# Patient Record
Sex: Female | Born: 1975 | Race: Black or African American | Hispanic: No | Marital: Married | Smoking: Never smoker
Health system: Southern US, Community
[De-identification: ages and names within clinical notes are randomized; demographics above are authoritative.]

## PROBLEM LIST (undated history)

## (undated) DIAGNOSIS — I10 Essential (primary) hypertension: Secondary | ICD-10-CM

## (undated) DIAGNOSIS — R011 Cardiac murmur, unspecified: Secondary | ICD-10-CM

## (undated) DIAGNOSIS — D649 Anemia, unspecified: Secondary | ICD-10-CM

## (undated) HISTORY — DX: Cardiac murmur, unspecified: R01.1

## (undated) HISTORY — PX: TUBAL LIGATION: SHX77

## (undated) HISTORY — DX: Essential (primary) hypertension: I10

## (undated) HISTORY — DX: Anemia, unspecified: D64.9

---

## 2011-08-04 HISTORY — PX: EYE SURGERY: SHX253

## 2013-01-29 ENCOUNTER — Institutional Professional Consult (permissible substitution): Payer: Self-pay | Admitting: Medical

## 2013-02-19 ENCOUNTER — Institutional Professional Consult (permissible substitution): Payer: Self-pay | Admitting: Medical

## 2013-03-13 ENCOUNTER — Encounter: Payer: Self-pay | Admitting: Obstetrics and Gynecology

## 2013-03-13 ENCOUNTER — Ambulatory Visit (INDEPENDENT_AMBULATORY_CARE_PROVIDER_SITE_OTHER): Payer: No Typology Code available for payment source | Admitting: Obstetrics and Gynecology

## 2013-03-13 VITALS — BP 131/95 | HR 67 | Ht 61.5 in | Wt 163.0 lb

## 2013-03-13 DIAGNOSIS — N938 Other specified abnormal uterine and vaginal bleeding: Secondary | ICD-10-CM

## 2013-03-13 DIAGNOSIS — N949 Unspecified condition associated with female genital organs and menstrual cycle: Secondary | ICD-10-CM

## 2013-03-13 LAB — TSH: TSH: 0.675 u[IU]/mL (ref 0.350–4.500)

## 2013-03-13 MED ORDER — NORGESTIMATE-ETH ESTRADIOL 0.25-35 MG-MCG PO TABS: 1.0000 | ORAL_TABLET | Freq: Every day | ORAL | Status: DC

## 2013-03-13 NOTE — Progress Notes (Signed)
  Subjective:    Patient ID: Katelyn Vargas, female    DOB: 01-27-1976, 37 y.o.   MRN: [id number]  HPI  37 yo [id number] with LMP 01/31/2013 and BMI 30 presenting today for evaluation of menorrhagia. Patient reports that since her tubal ligation 7 years ago, although her menses continue to occur on a monthly basis, the flow has become a lot heavier. She states that she at times fear leaving the house because she often soaks through her clothes. She at times skips periods.  Her cycles last 4-5 days with the first 2 days being the heaviest. Patient states that a similar pattern occurred a few years ago, she had a full work up inculding ultrasound which was negative for fibroids. She states that her cycles returned to its usual pattern until the past few years. She admits to being under a lot of stress. She recently moved from [place], is not currently working and her youngest child has a congenital heart defect for which her requires a lot of medical attention. Patient reports some heat intolerance and excessive hair loss in the past few months. She also reports a 20lb weight gain since March which she attributes to an increase in appetite.  Past Medical History  Diagnosis Date  . Anemia   . Heart murmur    Past Surgical History  Procedure Laterality Date  . Tubal ligation    . Eye surgery  08/04/2011    PRK  . Cesarean section  05/31/2004 08/09/2006    x2   Family History  Problem Relation Age of Onset  . Hypertension Mother   . Hyperlipidemia Mother   . Heart disease Mother   . Hyperlipidemia Father   . Seizures Daughter   . Heart disease Son     congential  . ADD / ADHD Son   . Hypertension Maternal Grandmother   . ADD / ADHD Son    History  Substance Use Topics  . Smoking status: Never Smoker   . Smokeless tobacco: Never Used  . Alcohol Use: Yes     Comment: social     Review of Systems  All other systems reviewed and are negative.       Objective:   Physical  Exam GENERAL: Well-developed, well-nourished female in no acute distress.  NECK: Supple. Normal thyroid.  ABDOMEN: Soft, nontender, nondistended. Obese PELVIC: Normal external female genitalia. Vagina is pink and rugated.  Normal discharge. Normal appearing cervix. Bimanual limited secondary to body habitus. No adnexal mass or tenderness. EXTREMITIES: No cyanosis, clubbing, or edema, 2+ distal pulses.      Assessment & Plan:  37 yo with menorrhagia and irregular cycles - Will check TSH -Will check pelvic ultrasound - Will try OCP for a few months - Patient advised to increase exercise level to help decrease stress and help with weight loss - RTC in 6 months or prn - Patient will be contacted with any abnormal results.

## 2013-03-15 ENCOUNTER — Ambulatory Visit (HOSPITAL_COMMUNITY): Admission: RE | Admit: 2013-03-15 | Payer: No Typology Code available for payment source | Source: Ambulatory Visit

## 2013-03-18 ENCOUNTER — Ambulatory Visit: Payer: No Typology Code available for payment source | Admitting: Obstetrics and Gynecology

## 2013-03-20 ENCOUNTER — Ambulatory Visit (HOSPITAL_COMMUNITY)
Admission: RE | Admit: 2013-03-20 | Discharge: 2013-03-20 | Disposition: A | Discharge status: Home / Self Care | Payer: No Typology Code available for payment source | Source: Ambulatory Visit | Attending: Obstetrics and Gynecology | Admitting: Obstetrics and Gynecology

## 2013-03-20 DIAGNOSIS — N854 Malposition of uterus: Secondary | ICD-10-CM | POA: Insufficient documentation

## 2013-03-20 DIAGNOSIS — N920 Excessive and frequent menstruation with regular cycle: Secondary | ICD-10-CM | POA: Insufficient documentation

## 2013-03-20 DIAGNOSIS — N938 Other specified abnormal uterine and vaginal bleeding: Secondary | ICD-10-CM

## 2013-03-27 ENCOUNTER — Ambulatory Visit (INDEPENDENT_AMBULATORY_CARE_PROVIDER_SITE_OTHER): Payer: No Typology Code available for payment source | Admitting: Emergency Medicine

## 2013-03-27 VITALS — BP 130/82 | HR 81 | Temp 98.8°F | Resp 16 | Ht 60.0 in | Wt 166.8 lb

## 2013-03-27 DIAGNOSIS — F909 Attention-deficit hyperactivity disorder, unspecified type: Secondary | ICD-10-CM

## 2013-03-27 MED ORDER — AMPHETAMINE-DEXTROAMPHETAMINE 10 MG PO TABS: 10.0000 mg | ORAL_TABLET | Freq: Two times a day (BID) | ORAL | Status: DC

## 2013-03-27 NOTE — Progress Notes (Signed)
[facility] [address] [place] [address] [phone]  Date:  03/27/2013   Name:  Katelyn Vargas   DOB:  09-29-1976   MRN:  [id number]  PCP:  Ernst Breach, PA-C    Chief Complaint: ADHD   History of Present Illness:  Katelyn Vargas is a 37 y.o. very pleasant female patient who presents with the following:  Moved here from [facility] and was evaluated by psychologist and referred for treatment of ADD.  She has provided a copy of her report.  She describes difficulty maintaining concentration and focus.  No improvement with over the counter medications or other home remedies. Denies other complaint or health concern today..  She is eating and sleeping normally.  There are no active problems to display for this patient.   Past Medical History  Diagnosis Date  . Anemia   . Heart murmur     Past Surgical History  Procedure Laterality Date  . Tubal ligation    . Eye surgery  08/04/2011    PRK  . Cesarean section  05/31/2004 08/09/2006    x2    History  Substance Use Topics  . Smoking status: Never Smoker   . Smokeless tobacco: Never Used  . Alcohol Use: Yes     Comment: social    Family History  Problem Relation Age of Onset  . Hypertension Mother   . Hyperlipidemia Mother   . Heart disease Mother   . Hyperlipidemia Father   . Seizures Daughter   . Heart disease Son     congential  . ADD / ADHD Son   . Hypertension Maternal Grandmother   . ADD / ADHD Son   . Hypertension Sister   . Mental illness Maternal Grandfather   . Dementia Paternal Grandfather     No Known Allergies  Medication list has been reviewed and updated.  Current Outpatient Prescriptions on File Prior to Visit  Medication Sig Dispense Refill  . ferrous fumarate (HEMOCYTE - 106 MG FE) 325 (106 FE) MG TABS Take 1 tablet by mouth daily.      Marland Kitchen lubiprostone (AMITIZA) 24 MCG capsule Take 24 mcg by mouth daily with breakfast.      . norgestimate-ethinyl estradiol  (ORTHO-CYCLEN,SPRINTEC,PREVIFEM) 0.25-35 MG-MCG tablet Take 1 tablet by mouth daily.  1 Package  6   No current facility-administered medications on file prior to visit.    Review of Systems:  As per HPI, otherwise negative.    Physical Examination: Filed Vitals:   03/27/13 1844  BP: 130/82  Pulse: 81  Temp: 98.8 F (37.1 C)  Resp: 16   Filed Vitals:   03/27/13 1844  Height: 5' (1.524 m)  Weight: 166 lb 12.8 oz (75.66 kg)   Body mass index is 32.58 kg/(m^2). Ideal Body Weight: Weight in (lb) to have BMI = 25: 127.7  GEN: WDWN, NAD, Non-toxic, A & O x 3 HEENT: Atraumatic, Normocephalic. Neck supple. No masses, No LAD. Ears and Nose: No external deformity. CV: RRR, No M/G/R. No JVD. No thrill. No extra heart sounds. PULM: CTA B, no wheezes, crackles, rhonchi. No retractions. No resp. distress. No accessory muscle use. ABD: S, NT, ND, +BS. No rebound. No HSM. EXTR: No c/c/e NEURO Normal gait.  PSYCH: Normally interactive. Conversant. Not depressed or anxious appearing.  Calm demeanor.    Assessment and Plan: ADD adderal Follow up in one month  Signed,  Phillips Odor, MD

## 2013-03-27 NOTE — Patient Instructions (Addendum)
Attention Deficit Hyperactivity Disorder Attention deficit hyperactivity disorder (ADHD) is a problem with behavior issues based on the way the brain functions (neurobehavioral disorder). It is a common reason for behavior and academic problems in school. CAUSES  The cause of ADHD is unknown in most cases. It may run in families. It sometimes can be associated with learning disabilities and other behavioral problems. SYMPTOMS  There are 3 types of ADHD. The 3 types and some of the symptoms include:  Inattentive  Gets bored or distracted easily.  Loses or forgets things. Forgets to hand in homework.  Has trouble organizing or completing tasks.  Difficulty staying on task.  An inability to organize daily tasks and school work.  Leaving projects, chores, or homework unfinished.  Trouble paying attention or responding to details. Careless mistakes.  Difficulty following directions. Often seems like is not listening.  Dislikes activities that require sustained attention (like chores or homework).  Hyperactive-impulsive  Feels like it is impossible to sit still or stay in a seat. Fidgeting with hands and feet.  Trouble waiting turn.  Talking too much or out of turn. Interruptive.  Speaks or acts impulsively.  Aggressive, disruptive behavior.  Constantly busy or on the go, noisy.  Combined  Has symptoms of both of the above. Often children with ADHD feel discouraged about themselves and with school. They often perform well below their abilities in school. These symptoms can cause problems in home, school, and in relationships with peers. As children get older, the excess motor activities can calm down, but the problems with paying attention and staying organized persist. Most children do not outgrow ADHD but with good treatment can learn to cope with the symptoms. DIAGNOSIS  When ADHD is suspected, the diagnosis should be made by professionals trained in ADHD.  Diagnosis will  include:  Ruling out other reasons for the child's behavior.  The caregivers will check with the child's school and check their medical records.  They will talk to teachers and parents.  Behavior rating scales for the child will be filled out by those dealing with the child on a daily basis. A diagnosis is made only after all information has been considered. TREATMENT  Treatment usually includes behavioral treatment often along with medicines. It may include stimulant medicines. The stimulant medicines decrease impulsivity and hyperactivity and increase attention. Other medicines used include antidepressants and certain blood pressure medicines. Most experts agree that treatment for ADHD should address all aspects of the child's functioning. Treatment should not be limited to the use of medicines alone. Treatment should include structured classroom management. The parents must receive education to address rewarding good behavior, discipline, and limit-setting. Tutoring or behavioral therapy or both should be available for the child. If untreated, the disorder can have long-term serious effects into adolescence and adulthood. HOME CARE INSTRUCTIONS   Often with ADHD there is a lot of frustration among the family in dealing with the illness. There is often blame and anger that is not warranted. This is a life long illness. There is no way to prevent ADHD. In many cases, because the problem affects the family as a whole, the entire family may need help. A therapist can help the family find better ways to handle the disruptive behaviors and promote change. If the child is young, most of the therapist's work is with the parents. Parents will learn techniques for coping with and improving their child's behavior. Sometimes only the child with the ADHD needs counseling. Your caregivers can help   you make these decisions.  Children with ADHD may need help in organizing. Some helpful tips include:  Keep  routines the same every day from wake-up time to bedtime. Schedule everything. This includes homework and playtime. This should include outdoor and indoor recreation. Keep the schedule on the refrigerator or a bulletin board where it is frequently seen. Mark schedule changes as far in advance as possible.  Have a place for everything and keep everything in its place. This includes clothing, backpacks, and school supplies.  Encourage writing down assignments and bringing home needed books.  Offer your child a well-balanced diet. Breakfast is especially important for school performance. Children should avoid drinks with caffeine including:  Soft drinks.  Coffee.  Tea.  However, some older children (adolescents) may find these drinks helpful in improving their attention.  Children with ADHD need consistent rules that they can understand and follow. If rules are followed, give small rewards. Children with ADHD often receive, and expect, criticism. Look for good behavior and praise it. Set realistic goals. Give clear instructions. Look for activities that can foster success and self-esteem. Make time for pleasant activities with your child. Give lots of affection.  Parents are their children's greatest advocates. Learn as much as possible about ADHD. This helps you become a stronger and better advocate for your child. It also helps you educate your child's teachers and instructors if they feel inadequate in these areas. Parent support groups are often helpful. A national group with local chapters is called CHADD (Children and Adults with Attention Deficit Hyperactivity Disorder). PROGNOSIS  There is no cure for ADHD. Children with the disorder seldom outgrow it. Many find adaptive ways to accommodate the ADHD as they mature. SEEK MEDICAL CARE IF:  Your child has repeated muscle twitches, cough or speech outbursts.  Your child has sleep problems.  Your child has a marked loss of  appetite.  Your child develops depression.  Your child has new or worsening behavioral problems.  Your child develops dizziness.  Your child has a racing heart.  Your child has stomach pains.  Your child develops headaches. Document Released: 09/09/2002 Document Revised: 12/12/2011 Document Reviewed: 04/21/2008 [employer] Patient Information 2014 [employer].  

## 2013-09-13 ENCOUNTER — Encounter: Payer: Self-pay | Admitting: Obstetrics and Gynecology

## 2013-09-13 ENCOUNTER — Ambulatory Visit (INDEPENDENT_AMBULATORY_CARE_PROVIDER_SITE_OTHER): Payer: No Typology Code available for payment source | Admitting: Obstetrics and Gynecology

## 2013-09-13 VITALS — BP 144/101 | HR 73 | Ht 61.0 in | Wt 166.0 lb

## 2013-09-13 DIAGNOSIS — M549 Dorsalgia, unspecified: Secondary | ICD-10-CM

## 2013-09-13 LAB — POCT URINALYSIS DIPSTICK
Bilirubin, UA: NEGATIVE
Blood, UA: NEGATIVE
Glucose, UA: NEGATIVE
Ketones, UA: NEGATIVE
Protein, UA: NEGATIVE
Spec Grav, UA: <=1.005
pH, UA: 6

## 2013-09-13 MED ORDER — NORGESTIMATE-ETH ESTRADIOL 0.25-35 MG-MCG PO TABS: 1.0000 | ORAL_TABLET | Freq: Every day | ORAL | Status: DC

## 2013-09-13 MED ORDER — CYCLOBENZAPRINE HCL 10 MG PO TABS: 10.0000 mg | ORAL_TABLET | Freq: Three times a day (TID) | ORAL | Status: DC | PRN

## 2013-09-13 NOTE — Progress Notes (Signed)
Patient ID: Katelyn Vargas, female   DOB: [birth year], 37 y.o.   MRN: [id number] 37 yo here for evaluation of 2-day history of right flank pain. Patient describes the pain as being Javanni Maring and stabbing in nature. She denies any heavy lifting or dysuria. Patient is also interested in tubal reversible as she wishes to have one more child.   GENERAL: Well-developed, well-nourished female in no acute distress.  ABDOMEN: Soft, nontender, nondistended. No organomegaly. Back: NO CVA tenderness, pain is reproducible on exam in right flank EXTREMITIES: No cyanosis, clubbing, or edema, 2+ distal pulses.  A/P 37 yo with Right flank pain - UA negative- will send Ucx - Pain likely musculoskeletal Rx flexeril provided - Patient noted to have high blood pressure. 3 refills of OCP provided. Information on progesterone only birth control provided - Referred to PCP for management of HTN - Referred to infertility for possible tubal reversal - RTC prn

## 2013-09-13 NOTE — Patient Instructions (Signed)
Medroxyprogesterone injection [Contraceptive] What is this medicine? MEDROXYPROGESTERONE (me DROX ee proe JES te rone) contraceptive injections prevent pregnancy. They provide effective birth control for 3 months. Depo-subQ Provera 104 is also used for treating pain related to endometriosis. This medicine may be used for other purposes; ask your health care provider or pharmacist if you have questions. COMMON BRAND NAME(S): Depo-Provera, Depo-subQ Provera 104 What should I tell my health care provider before I take this medicine? They need to know if you have any of these conditions: -frequently drink alcohol -asthma -blood vessel disease or a history of a blood clot in the lungs or legs -bone disease such as osteoporosis -breast cancer -diabetes -eating disorder (anorexia nervosa or bulimia) -high blood pressure -HIV infection or AIDS -kidney disease -liver disease -mental depression -migraine -seizures (convulsions) -stroke -tobacco smoker -vaginal bleeding -an unusual or allergic reaction to medroxyprogesterone, other hormones, medicines, foods, dyes, or preservatives -pregnant or trying to get pregnant -breast-feeding How should I use this medicine? Depo-Provera Contraceptive injection is given into a muscle. Depo-subQ Provera 104 injection is given under the skin. These injections are given by a health care professional. You must not be pregnant before getting an injection. The injection is usually given during the first 5 days after the start of a menstrual period or 6 weeks after delivery of a baby. Talk to your pediatrician regarding the use of this medicine in children. Special care may be needed. These injections have been used in female children who have started having menstrual periods. Overdosage: If you think you have taken too much of this medicine contact a poison control center or emergency room at once. NOTE: This medicine is only for you. Do not share this medicine  with others. What if I miss a dose? Try not to miss a dose. You must get an injection once every 3 months to maintain birth control. If you cannot keep an appointment, call and reschedule it. If you wait longer than 13 weeks between Depo-Provera contraceptive injections or longer than 14 weeks between Depo-subQ Provera 104 injections, you could get pregnant. Use another method for birth control if you miss your appointment. You may also need a pregnancy test before receiving another injection. What may interact with this medicine? Do not take this medicine with any of the following medications: -bosentan This medicine may also interact with the following medications: -aminoglutethimide -antibiotics or medicines for infections, especially rifampin, rifabutin, rifapentine, and griseofulvin -aprepitant -barbiturate medicines such as phenobarbital or primidone -bexarotene -carbamazepine -medicines for seizures like ethotoin, felbamate, oxcarbazepine, phenytoin, topiramate -modafinil -St. John's wort This list may not describe all possible interactions. Give your health care provider a list of all the medicines, herbs, non-prescription drugs, or dietary supplements you use. Also tell them if you smoke, drink alcohol, or use illegal drugs. Some items may interact with your medicine. What should I watch for while using this medicine? This drug does not protect you against HIV infection (AIDS) or other sexually transmitted diseases. Use of this product may cause you to lose calcium from your bones. Loss of calcium may cause weak bones (osteoporosis). Only use this product for more than 2 years if other forms of birth control are not right for you. The longer you use this product for birth control the more likely you will be at risk for weak bones. Ask your health care professional how you can keep strong bones. You may have a change in bleeding pattern or irregular periods. Many females stop having    periods while taking this drug. If you have received your injections on time, your chance of being pregnant is very low. If you think you may be pregnant, see your health care professional as soon as possible. Tell your health care professional if you want to get pregnant within the next year. The effect of this medicine may last a long time after you get your last injection. What side effects may I notice from receiving this medicine? Side effects that you should report to your doctor or health care professional as soon as possible: -allergic reactions like skin rash, itching or hives, swelling of the face, lips, or tongue -breast tenderness or discharge -breathing problems -changes in vision -depression -feeling faint or lightheaded, falls -fever -pain in the abdomen, chest, groin, or leg -problems with balance, talking, walking -unusually weak or tired -yellowing of the eyes or skin Side effects that usually do not require medical attention (report to your doctor or health care professional if they continue or are bothersome): -acne -fluid retention and swelling -headache -irregular periods, spotting, or absent periods -temporary pain, itching, or skin reaction at site where injected -weight gain This list may not describe all possible side effects. Call your doctor for medical advice about side effects. You may report side effects to FDA at 1-800-FDA-1088. Where should I keep my medicine? This does not apply. The injection will be given to you by a health care professional. NOTE: This sheet is a summary. It may not cover all possible information. If you have questions about this medicine, talk to your doctor, pharmacist, or health care provider.  2014, [employer] Standard. (2008-10-10 18:37:56) Etonogestrel implant What is this medicine? ETONOGESTREL (et oh noe JES trel) is a contraceptive (birth control) device. It is used to prevent pregnancy. It can be used for up to 3  years. This medicine may be used for other purposes; ask your health care provider or pharmacist if you have questions. COMMON BRAND NAME(S): Implanon, Nexplanon  What should I tell my health care provider before I take this medicine? They need to know if you have any of these conditions: -abnormal vaginal bleeding -blood vessel disease or blood clots -cancer of the breast, cervix, or liver -depression -diabetes -gallbladder disease -headaches -heart disease or recent heart attack -high blood pressure -high cholesterol -kidney disease -liver disease -renal disease -seizures -tobacco smoker -an unusual or allergic reaction to etonogestrel, other hormones, anesthetics or antiseptics, medicines, foods, dyes, or preservatives -pregnant or trying to get pregnant -breast-feeding How should I use this medicine? This device is inserted just under the skin on the inner side of your upper arm by a health care professional. Talk to your pediatrician regarding the use of this medicine in children. Special care may be needed. Overdosage: If you think you've taken too much of this medicine contact a poison control center or emergency room at once. Overdosage: If you think you have taken too much of this medicine contact a poison control center or emergency room at once. NOTE: This medicine is only for you. Do not share this medicine with others. What if I miss a dose? This does not apply. What may interact with this medicine? Do not take this medicine with any of the following medications: -amprenavir -bosentan -fosamprenavir This medicine may also interact with the following medications: -barbiturate medicines for inducing sleep or treating seizures -certain medicines for fungal infections like ketoconazole and itraconazole -griseofulvin -medicines to treat seizures like carbamazepine, felbamate, oxcarbazepine, phenytoin, topiramate -modafinil -phenylbutazone -rifampin -some  medicines  to treat HIV infection like atazanavir, indinavir, lopinavir, nelfinavir, tipranavir, ritonavir -St. John's wort This list may not describe all possible interactions. Give your health care provider a list of all the medicines, herbs, non-prescription drugs, or dietary supplements you use. Also tell them if you smoke, drink alcohol, or use illegal drugs. Some items may interact with your medicine. What should I watch for while using this medicine? This product does not protect you against HIV infection (AIDS) or other sexually transmitted diseases. You should be able to feel the implant by pressing your fingertips over the skin where it was inserted. Tell your doctor if you cannot feel the implant. What side effects may I notice from receiving this medicine? Side effects that you should report to your doctor or health care professional as soon as possible: -allergic reactions like skin rash, itching or hives, swelling of the face, lips, or tongue -breast lumps -changes in vision -confusion, trouble speaking or understanding -dark urine -depressed mood -general ill feeling or flu-like symptoms -light-colored stools -loss of appetite, nausea -right upper belly pain -severe headaches -severe pain, swelling, or tenderness in the abdomen -shortness of breath, chest pain, swelling in a leg -signs of pregnancy -sudden numbness or weakness of the face, arm or leg -trouble walking, dizziness, loss of balance or coordination -unusual vaginal bleeding, discharge -unusually weak or tired -yellowing of the eyes or skin Side effects that usually do not require medical attention (Report these to your doctor or health care professional if they continue or are bothersome.): -acne -breast pain -changes in weight -cough -fever or chills -headache -irregular menstrual bleeding -itching, burning, and vaginal discharge -pain or difficulty passing urine -sore throat This list may not describe all  possible side effects. Call your doctor for medical advice about side effects. You may report side effects to FDA at 1-800-FDA-1088. Where should I keep my medicine? This drug is given in a hospital or clinic and will not be stored at home. NOTE: This sheet is a summary. It may not cover all possible information. If you have questions about this medicine, talk to your doctor, pharmacist, or health care provider.  2014, [employer] Standard. (2012-03-26 15:37:45) Levonorgestrel intrauterine device (IUD) What is this medicine? LEVONORGESTREL IUD (LEE voe nor jes trel) is a contraceptive (birth control) device. The device is placed inside the uterus by a healthcare professional. It is used to prevent pregnancy and can also be used to treat heavy bleeding that occurs during your period. Depending on the device, it can be used for 3 to 5 years. This medicine may be used for other purposes; ask your health care provider or pharmacist if you have questions. COMMON BRAND NAME(S): Gretta Cool What should I tell my health care provider before I take this medicine? They need to know if you have any of these conditions: -abnormal Pap smear -cancer of the breast, uterus, or cervix -diabetes -endometritis -genital or pelvic infection now or in the past -have more than one sexual partner or your partner has more than one partner -heart disease -history of an ectopic or tubal pregnancy -immune system problems -IUD in place -liver disease or tumor -problems with blood clots or take blood-thinners -use intravenous drugs -uterus of unusual shape -vaginal bleeding that has not been explained -an unusual or allergic reaction to levonorgestrel, other hormones, silicone, or polyethylene, medicines, foods, dyes, or preservatives -pregnant or trying to get pregnant -breast-feeding How should I use this medicine? This device is placed inside the uterus  by a health care professional. Talk to your  pediatrician regarding the use of this medicine in children. Special care may be needed. Overdosage: If you think you have taken too much of this medicine contact a poison control center or emergency room at once. NOTE: This medicine is only for you. Do not share this medicine with others. What if I miss a dose? This does not apply. What may interact with this medicine? Do not take this medicine with any of the following medications: -amprenavir -bosentan -fosamprenavir This medicine may also interact with the following medications: -aprepitant -barbiturate medicines for inducing sleep or treating seizures -bexarotene -griseofulvin -medicines to treat seizures like carbamazepine, ethotoin, felbamate, oxcarbazepine, phenytoin, topiramate -modafinil -pioglitazone -rifabutin -rifampin -rifapentine -some medicines to treat HIV infection like atazanavir, indinavir, lopinavir, nelfinavir, tipranavir, ritonavir -St. John's wort -warfarin This list may not describe all possible interactions. Give your health care provider a list of all the medicines, herbs, non-prescription drugs, or dietary supplements you use. Also tell them if you smoke, drink alcohol, or use illegal drugs. Some items may interact with your medicine. What should I watch for while using this medicine? Visit your doctor or health care professional for regular check ups. See your doctor if you or your partner has sexual contact with others, becomes HIV positive, or gets a sexual transmitted disease. This product does not protect you against HIV infection (AIDS) or other sexually transmitted diseases. You can check the placement of the IUD yourself by reaching up to the top of your vagina with clean fingers to feel the threads. Do not pull on the threads. It is a good habit to check placement after each menstrual period. Call your doctor right away if you feel more of the IUD than just the threads or if you cannot feel the threads  at all. The IUD may come out by itself. You may become pregnant if the device comes out. If you notice that the IUD has come out use a backup birth control method like condoms and call your health care provider. Using tampons will not change the position of the IUD and are okay to use during your period. What side effects may I notice from receiving this medicine? Side effects that you should report to your doctor or health care professional as soon as possible: -allergic reactions like skin rash, itching or hives, swelling of the face, lips, or tongue -fever, flu-like symptoms -genital sores -high blood pressure -no menstrual period for 6 weeks during use -pain, swelling, warmth in the leg -pelvic pain or tenderness -severe or sudden headache -signs of pregnancy -stomach cramping -sudden shortness of breath -trouble with balance, talking, or walking -unusual vaginal bleeding, discharge -yellowing of the eyes or skin Side effects that usually do not require medical attention (report to your doctor or health care professional if they continue or are bothersome): -acne -breast pain -change in sex drive or performance -changes in weight -cramping, dizziness, or faintness while the device is being inserted -headache -irregular menstrual bleeding within first 3 to 6 months of use -nausea This list may not describe all possible side effects. Call your doctor for medical advice about side effects. You may report side effects to FDA at 1-800-FDA-1088. Where should I keep my medicine? This does not apply. NOTE: This sheet is a summary. It may not cover all possible information. If you have questions about this medicine, talk to your doctor, pharmacist, or health care provider.  2014, [employer]. (2011-10-20 13:54:04)

## 2013-09-15 LAB — URINE CULTURE: Colony Count: 9000

## 2013-09-24 ENCOUNTER — Telehealth: Payer: Self-pay | Admitting: Internal Medicine

## 2013-09-24 NOTE — Telephone Encounter (Signed)
Left mssg for pt to call back.

## 2013-09-24 NOTE — Telephone Encounter (Signed)
I can see her on 12/31 if she is availabe, just take 2 15 minute slots

## 2013-09-24 NOTE — Telephone Encounter (Signed)
Pt is scheduled for a new patient apptmt on 10/07/2013, however, she is starting a new job that day and doesn't want to go in late on her first day.  Can you accommodate a new patient apptmt prior to 10/07/2013? Thank you.

## 2013-10-07 ENCOUNTER — Ambulatory Visit: Payer: No Typology Code available for payment source | Admitting: Internal Medicine

## 2013-10-22 NOTE — Telephone Encounter (Signed)
Pt scheduled 10/24/2013

## 2013-10-24 ENCOUNTER — Ambulatory Visit: Payer: No Typology Code available for payment source | Admitting: Internal Medicine

## 2013-10-24 DIAGNOSIS — Z0289 Encounter for other administrative examinations: Secondary | ICD-10-CM

## 2013-11-01 ENCOUNTER — Encounter: Payer: Self-pay | Admitting: Physician Assistant

## 2013-11-01 ENCOUNTER — Ambulatory Visit (INDEPENDENT_AMBULATORY_CARE_PROVIDER_SITE_OTHER): Payer: No Typology Code available for payment source | Admitting: Physician Assistant

## 2013-11-01 VITALS — BP 130/60 | HR 81 | Temp 99.2°F | Resp 18 | Ht 61.0 in | Wt 169.8 lb

## 2013-11-01 DIAGNOSIS — Z0000 Encounter for general adult medical examination without abnormal findings: Secondary | ICD-10-CM

## 2013-11-01 DIAGNOSIS — F909 Attention-deficit hyperactivity disorder, unspecified type: Secondary | ICD-10-CM

## 2013-11-01 DIAGNOSIS — K5900 Constipation, unspecified: Secondary | ICD-10-CM

## 2013-11-01 DIAGNOSIS — D649 Anemia, unspecified: Secondary | ICD-10-CM

## 2013-11-01 MED ORDER — LUBIPROSTONE 24 MCG PO CAPS
24.0000 ug | ORAL_CAPSULE | Freq: Every day | ORAL | Status: DC
Start: 2013-11-01 — End: 2016-12-12

## 2013-11-01 MED ORDER — AMPHETAMINE-DEXTROAMPHETAMINE 10 MG PO TABS: 10.0000 mg | ORAL_TABLET | Freq: Two times a day (BID) | ORAL | Status: DC

## 2013-11-01 NOTE — Patient Instructions (Signed)
It was great meeting you today Katelyn Vargas!  Labs have been ordered for you, when you report to lab please be fasting.    Anemia, Nonspecific Anemia is a condition in which the concentration of red blood cells or hemoglobin in the blood is below normal. Hemoglobin is a substance in red blood cells that carries oxygen to the tissues of the body. Anemia results in not enough oxygen reaching these tissues.  CAUSES  Common causes of anemia include:   Excessive bleeding. Bleeding may be internal or external. This includes excessive bleeding from periods (in women) or from the intestine.   Poor nutrition.   Chronic kidney, thyroid, and liver disease.  Bone marrow disorders that decrease red blood cell production.  Cancer and treatments for cancer.  HIV, AIDS, and their treatments.  Spleen problems that increase red blood cell destruction.  Blood disorders.  Excess destruction of red blood cells due to infection, medicines, and autoimmune disorders. SIGNS AND SYMPTOMS   Minor weakness.   Dizziness.   Headache.  Palpitations.   Shortness of breath, especially with exercise.   Paleness.  Cold sensitivity.  Indigestion.  Nausea.  Difficulty sleeping.  Difficulty concentrating. Symptoms may occur suddenly or they may develop slowly.  DIAGNOSIS  Additional blood tests are often needed. These help your health care provider determine the best treatment. Your health care provider will check your stool for blood and look for other causes of blood loss.  TREATMENT  Treatment varies depending on the cause of the anemia. Treatment can include:   Supplements of iron, vitamin C37, or folic acid.   Hormone medicines.   A blood transfusion. This may be needed if blood loss is severe.   Hospitalization. This may be needed if there is significant continual blood loss.   Dietary changes.  Spleen removal. HOME CARE INSTRUCTIONS Keep all follow-up appointments. It often  takes many weeks to correct anemia, and having your health care provider check on your condition and your response to treatment is very important. SEEK IMMEDIATE MEDICAL CARE IF:   You develop extreme weakness, shortness of breath, or chest pain.   You become dizzy or have trouble concentrating.  You develop heavy vaginal bleeding.   You develop a rash.   You have bloody or black, tarry stools.   You faint.   You vomit up blood.   You vomit repeatedly.   You have abdominal pain.  You have a fever or persistent symptoms for more than 2 3 days.   You have a fever and your symptoms suddenly get worse.   You are dehydrated.  MAKE SURE YOU:  Understand these instructions.  Will watch your condition.  Will get help right away if you are not doing well or get worse. Document Released: 10/27/2004 Document Revised: 05/22/2013 Document Reviewed: 03/15/2013 [facility] Patient Information 2014 [place]. Attention Deficit Hyperactivity Disorder Attention deficit hyperactivity disorder (ADHD) is a problem with behavior issues based on the way the brain functions (neurobehavioral disorder). It is a common reason for behavior and academic problems in school. SYMPTOMS  There are 3 types of ADHD. The 3 types and some of the symptoms include:  Inattentive  Gets bored or distracted easily.  Loses or forgets things. Forgets to hand in homework.  Has trouble organizing or completing tasks.  Difficulty staying on task.  An inability to organize daily tasks and school work.  Leaving projects, chores, or homework unfinished.  Trouble paying attention or responding to details. Careless mistakes.  Difficulty following directions. Often seems like is not listening.  Dislikes activities that require sustained attention (like chores or homework).  Hyperactive-impulsive  Feels like it is impossible to sit still or stay in a seat. Fidgeting with hands and feet.  Trouble  waiting turn.  Talking too much or out of turn. Interruptive.  Speaks or acts impulsively.  Aggressive, disruptive behavior.  Constantly busy or on the go, noisy.  Often leaves seat when they are expected to remain seated.  Often runs or climbs where it is not appropriate, or feels very restless.  Combined  Has symptoms of both of the above. Often children with ADHD feel discouraged about themselves and with school. They often perform well below their abilities in school. As children get older, the excess motor activities can calm down, but the problems with paying attention and staying organized persist. Most children do not outgrow ADHD but with good treatment can learn to cope with the symptoms. DIAGNOSIS  When ADHD is suspected, the diagnosis should be made by professionals trained in ADHD. This professional will collect information about the individual suspected of having ADHD. Information must be collected from various settings where the person lives, works, or attends school.  Diagnosis will include:  Confirming symptoms began in childhood.  Ruling out other reasons for the child's behavior.  The health care providers will check with the child's school and check their medical records.  They will talk to teachers and parents.  Behavior rating scales for the child will be filled out by those dealing with the child on a daily basis. A diagnosis is made only after all information has been considered. TREATMENT  Treatment usually includes behavioral treatment, tutoring or extra support in school, and stimulant medicines. Because of the way a person's brain works with ADHD, these medicines decrease impulsivity and hyperactivity and increase attention. This is different than how they would work in a person who does not have ADHD. Other medicines used include antidepressants and certain blood pressure medicines. Most experts agree that treatment for ADHD should address all aspects  of the person's functioning. Along with medicines, treatment should include structured classroom management at school. Parents should reward good behavior, provide constant discipline, and limit-setting. Tutoring should be available for the child as needed. ADHD is a life-long condition. If untreated, the disorder can have long-term serious effects into adolescence and adulthood. HOME CARE INSTRUCTIONS   Often with ADHD there is a lot of frustration among family members dealing with the condition. Blame and anger are also feelings that are common. In many cases, because the problem affects the family as a whole, the entire family may need help. A therapist can help the family find better ways to handle the disruptive behaviors of the person with ADHD and promote change. If the person with ADHD is young, most of the therapist's work is with the parents. Parents will learn techniques for coping with and improving their child's behavior. Sometimes only the child with the ADHD needs counseling. Your health care providers can help you make these decisions.  Children with ADHD may need help learning how to organize. Some helpful tips include:  Keep routines the same every day from wake-up time to bedtime. Schedule all activities, including homework and playtime. Keep the schedule in a place where the person with ADHD will often see it. Mark schedule changes as far in advance as possible.  Schedule outdoor and indoor recreation.  Have a place for everything and keep everything in its  place. This includes clothing, backpacks, and school supplies.  Encourage writing down assignments and bringing home needed books. Work with your child's teachers for assistance in organizing school work.  Offer your child a well-balanced diet. Breakfast that includes a balance of whole grains, protein and, fruits or vegetables is especially important for school performance. Children should avoid drinks with caffeine  including:  Soft drinks.  Coffee.  Tea.  However, some older children (adolescents) may find these drinks helpful in improving their attention. Because it can also be common for adolescents with ADHD to become addicted to caffeine, talk with your health care provider about what is a safe amount of caffeine intake for your child.  Children with ADHD need consistent rules that they can understand and follow. If rules are followed, give small rewards. Children with ADHD often receive, and expect, criticism. Look for good behavior and praise it. Set realistic goals. Give clear instructions. Look for activities that can foster success and self-esteem. Make time for pleasant activities with your child. Give lots of affection.  Parents are their children's greatest advocates. Learn as much as possible about ADHD. This helps you become a stronger and better advocate for your child. It also helps you educate your child's teachers and instructors if they feel inadequate in these areas. Parent support groups are often helpful. A national group with local chapters is called Children and Adults with Attention Deficit Hyperactivity Disorder (CHADD). SEEK MEDICAL CARE IF:  Your child has repeated muscle twitches, cough or speech outbursts.  Your child has sleep problems.  Your child has a marked loss of appetite.  Your child develops depression.  Your child has new or worsening behavioral problems.  Your child develops dizziness.  Your child has a racing heart.  Your child has stomach pains.  Your child develops headaches. SEEK IMMEDIATE MEDICAL CARE IF:  Your child has been diagnosed with depression or anxiety and the symptoms seem to be getting worse.  Your child has been depressed and suddenly appears to have increased energy or motivation.  You are worried that your child is having a bad reaction to a medication he or she is taking for ADHD. Document Released: 09/09/2002 Document Revised:  07/10/2013 Document Reviewed: 05/27/2013 [facility] Patient Information 2014 [place]. Health Maintenance, Female A healthy lifestyle and preventative care can promote health and wellness.  Maintain regular health, dental, and eye exams.  Eat a healthy diet. Foods like vegetables, fruits, whole grains, low-fat dairy products, and lean protein foods contain the nutrients you need without too many calories. Decrease your intake of foods high in solid fats, added sugars, and salt. Get information about a proper diet from your caregiver, if necessary.  Regular physical exercise is one of the most important things you can do for your health. Most adults should get at least 150 minutes of moderate-intensity exercise (any activity that increases your heart rate and causes you to sweat) each week. In addition, most adults need muscle-strengthening exercises on 2 or more days a week.   Maintain a healthy weight. The body mass index (BMI) is a screening tool to identify possible weight problems. It provides an estimate of body fat based on height and weight. Your caregiver can help determine your BMI, and can help you achieve or maintain a healthy weight. For adults 20 years and older:  A BMI below 18.5 is considered underweight.  A BMI of 18.5 to 24.9 is normal.  A BMI of 25 to 29.9 is considered overweight.  A BMI of 30 and above is considered obese.  Maintain normal blood lipids and cholesterol by exercising and minimizing your intake of saturated fat. Eat a balanced diet with plenty of fruits and vegetables. Blood tests for lipids and cholesterol should begin at age 33 and be repeated every 5 years. If your lipid or cholesterol levels are high, you are over 50, or you are a high risk for heart disease, you may need your cholesterol levels checked more frequently.Ongoing high lipid and cholesterol levels should be treated with medicines if diet and exercise are not effective.  If you smoke,  find out from your caregiver how to quit. If you do not use tobacco, do not start.  Lung cancer screening is recommended for adults aged 35 80 years who are at high risk for developing lung cancer because of a history of smoking. Yearly low-dose computed tomography (CT) is recommended for people who have at least a 30-pack-year history of smoking and are a current smoker or have quit within the past 15 years. A pack year of smoking is smoking an average of 1 pack of cigarettes a day for 1 year (for example: 1 pack a day for 30 years or 2 packs a day for 15 years). Yearly screening should continue until the smoker has stopped smoking for at least 15 years. Yearly screening should also be stopped for people who develop a health problem that would prevent them from having lung cancer treatment.  If you are pregnant, do not drink alcohol. If you are breastfeeding, be very cautious about drinking alcohol. If you are not pregnant and choose to drink alcohol, do not exceed 1 drink per day. One drink is considered to be 12 ounces (355 mL) of beer, 5 ounces (148 mL) of wine, or 1.5 ounces (44 mL) of liquor.  Avoid use of street drugs. Do not share needles with anyone. Ask for help if you need support or instructions about stopping the use of drugs.  High blood pressure causes heart disease and increases the risk of stroke. Blood pressure should be checked at least every 1 to 2 years. Ongoing high blood pressure should be treated with medicines, if weight loss and exercise are not effective.  If you are 48 to 38 years old, ask your caregiver if you should take aspirin to prevent strokes.  Diabetes screening involves taking a blood sample to check your fasting blood sugar level. This should be done once every 3 years, after age 16, if you are within normal weight and without risk factors for diabetes. Testing should be considered at a younger age or be carried out more frequently if you are overweight and have at  least 1 risk factor for diabetes.  Breast cancer screening is essential preventative care for women. You should practice "breast self-awareness." This means understanding the normal appearance and feel of your breasts and may include breast self-examination. Any changes detected, no matter how small, should be reported to a caregiver. Women in their 68s and 30s should have a clinical breast exam (CBE) by a caregiver as part of a regular health exam every 1 to 3 years. After age 4, women should have a CBE every year. Starting at age 46, women should consider having a mammogram (breast X-ray) every year. Women who have a family history of breast cancer should talk to their caregiver about genetic screening. Women at a high risk of breast cancer should talk to their caregiver about having an MRI and a  mammogram every year.  Breast cancer gene (BRCA)-related cancer risk assessment is recommended for women who have family members with BRCA-related cancers. BRCA-related cancers include breast, ovarian, tubal, and peritoneal cancers. Having family members with these cancers may be associated with an increased risk for harmful changes (mutations) in the breast cancer genes BRCA1 and BRCA2. Results of the assessment will determine the need for genetic counseling and BRCA1 and BRCA2 testing.  The Pap test is a screening test for cervical cancer. Women should have a Pap test starting at age 23. Between ages 5 and 87, Pap tests should be repeated every 2 years. Beginning at age 66, you should have a Pap test every 3 years as long as the past 3 Pap tests have been normal. If you had a hysterectomy for a problem that was not cancer or a condition that could lead to cancer, then you no longer need Pap tests. If you are between ages 92 and 50, and you have had normal Pap tests going back 10 years, you no longer need Pap tests. If you have had past treatment for cervical cancer or a condition that could lead to cancer, you  need Pap tests and screening for cancer for at least 20 years after your treatment. If Pap tests have been discontinued, risk factors (such as a new sexual partner) need to be reassessed to determine if screening should be resumed. Some women have medical problems that increase the chance of getting cervical cancer. In these cases, your caregiver may recommend more frequent screening and Pap tests.  The human papillomavirus (HPV) test is an additional test that may be used for cervical cancer screening. The HPV test looks for the virus that can cause the cell changes on the cervix. The cells collected during the Pap test can be tested for HPV. The HPV test could be used to screen women aged 44 years and older, and should be used in women of any age who have unclear Pap test results. After the age of 77, women should have HPV testing at the same frequency as a Pap test.  Colorectal cancer can be detected and often prevented. Most routine colorectal cancer screening begins at the age of 31 and continues through age 7. However, your caregiver may recommend screening at an earlier age if you have risk factors for colon cancer. On a yearly basis, your caregiver may provide home test kits to check for hidden blood in the stool. Use of a small camera at the end of a tube, to directly examine the colon (sigmoidoscopy or colonoscopy), can detect the earliest forms of colorectal cancer. Talk to your caregiver about this at age 83, when routine screening begins. Direct examination of the colon should be repeated every 5 to 10 years through age 8, unless early forms of pre-cancerous polyps or small growths are found.  Hepatitis C blood testing is recommended for all people born from [birth year] through 1965 and any individual with known risks for hepatitis C.  Practice safe sex. Use condoms and avoid high-risk sexual practices to reduce the spread of sexually transmitted infections (STIs). Sexually active women aged 45 and  younger should be checked for Chlamydia, which is a common sexually transmitted infection. Older women with new or multiple partners should also be tested for Chlamydia. Testing for other STIs is recommended if you are sexually active and at increased risk.  Osteoporosis is a disease in which the bones lose minerals and strength with aging. This can result in  serious bone fractures. The risk of osteoporosis can be identified using a bone density scan. Women ages 28 and over and women at risk for fractures or osteoporosis should discuss screening with their caregivers. Ask your caregiver whether you should be taking a calcium supplement or vitamin D to reduce the rate of osteoporosis.  Menopause can be associated with physical symptoms and risks. Hormone replacement therapy is available to decrease symptoms and risks. You should talk to your caregiver about whether hormone replacement therapy is right for you.  Use sunscreen. Apply sunscreen liberally and repeatedly throughout the day. You should seek shade when your shadow is shorter than you. Protect yourself by wearing long sleeves, pants, a wide-brimmed hat, and sunglasses year round, whenever you are outdoors.  Notify your caregiver of new moles or changes in moles, especially if there is a change in shape or color. Also notify your caregiver if a mole is larger than the size of a pencil eraser.  Stay current with your immunizations. Document Released: 04/04/2011 Document Revised: 01/14/2013 Document Reviewed: 04/04/2011 [facility] Patient Information 2014 [place].

## 2013-11-01 NOTE — Progress Notes (Signed)
Pre-visit discussion using our clinic review tool. No additional management support is needed unless otherwise documented below in the visit note.  

## 2013-11-03 NOTE — Progress Notes (Signed)
Subjective:    Patient ID: Katelyn Vargas, female    DOB: [birth year], 38 y.o.   MRN: [id number]  HPI Comments: Patient is a pleasant 38 year old female who presents to the clinic to establish care. Patient fairly new to area relocating from [place], [place]. Patient reports she and her husband both were originally scheduled at the [facility] office however, she could not make her appointment and would prefer to establish somewhere closer to home. Patient states the copies of her current medical records were taken to [facility] along with her husband. Reports she will bring copies to this location on Monday today she-2015, when she returns for lab work. Reports history of ADHD well-controlled with Adderall, reports she does need a refill. Also reports history of anemia well treated with iron supplements, and history of intermittent constipation secondary to iron supplements. Symptomatic relief with Amitiza. Reports no concerns at this time. Denies chest pain/palpitations, shortness of breath, cough, headache, dizziness/lightheadedness, numbness or weakness, fever or change in appetite.  Influenza vaccine: 11/14 Tetanus: 2010 Pap: 11/14  Review of Systems  Constitutional: Negative for fever, chills, activity change and appetite change.  Respiratory: Negative for cough, chest tightness and shortness of breath.   Cardiovascular: Negative for chest pain and palpitations.  Gastrointestinal: Negative for nausea, vomiting and diarrhea.  Genitourinary: Negative.   Musculoskeletal: Positive for back pain (has had in past, currently resolved.).  Neurological: Negative for dizziness, weakness, light-headedness, numbness and headaches.  All other systems reviewed and are negative.   Past Medical History  Diagnosis Date  . Anemia   . Heart murmur    Family History  Problem Relation Age of Onset  . Hypertension Mother   . Hyperlipidemia Mother   . Heart disease Mother   . Hyperlipidemia  Father   . Seizures Daughter   . Heart disease Son     congential  . ADD / ADHD Son   . Hypertension Maternal Grandmother   . ADD / ADHD Son   . Hypertension Sister   . Mental illness Maternal Grandfather   . Dementia Paternal Grandfather    History   Social History  . Marital Status: Married    Spouse Name: N/A    Number of Children: N/A  . Years of Education: N/A   Social History Main Topics  . Smoking status: Never Smoker   . Smokeless tobacco: Never Used  . Alcohol Use: Yes     Comment: social  . Drug Use: No  . Sexual Activity: Yes    Partners: Male    Birth Control/ Protection: Surgical   Other Topics Concern  . None   Social History Narrative  . None   Past Surgical History  Procedure Laterality Date  . Tubal ligation    . Eye surgery  08/04/2011    PRK  . Cesarean section  05/31/2004 08/09/2006    x2       Objective:   Physical Exam  Vitals reviewed. Constitutional: She is oriented to person, place, and time. She appears well-developed and well-nourished. No distress.  HENT:  Head: Normocephalic and atraumatic.  Right Ear: Hearing, tympanic membrane, external ear and ear canal normal.  Left Ear: Hearing, tympanic membrane, external ear and ear canal normal.  Nose: Nose normal.  Mouth/Throat: Uvula is midline, oropharynx is clear and moist and mucous membranes are normal.  Eyes: Conjunctivae and EOM are normal. Pupils are equal, round, and reactive to light. No scleral icterus.  Neck: Trachea normal and normal  range of motion. No thyromegaly present.  Cardiovascular: Normal rate, regular rhythm, S1 normal, S2 normal, normal heart sounds and normal pulses.   Pulses:      Radial pulses are 2+ on the right side, and 2+ on the left side.       Posterior tibial pulses are 2+ on the right side, and 2+ on the left side.  Pulmonary/Chest: Effort normal. She has no wheezes. She has no rhonchi. She has no rales.  Abdominal: Soft. Normal appearance and bowel  sounds are normal. There is hepatosplenomegaly. There is no tenderness.  Genitourinary:  Deferred to GYN  Musculoskeletal:  FROM of U/LE  Lymphadenopathy:    She has no cervical adenopathy.       Right: No supraclavicular adenopathy present.       Left: No supraclavicular adenopathy present.  Neurological: She is alert and oriented to person, place, and time. She has normal strength. No cranial nerve deficit or sensory deficit. She displays a negative Romberg sign. Coordination and gait normal. GCS eye subscore is 4. GCS verbal subscore is 5. GCS motor subscore is 6.  Skin: Skin is warm and dry.  Psychiatric: She has a normal mood and affect. Her speech is normal and behavior is normal.    Lab Results  Component Value Date   TSH 0.675 03/13/2013      Assessment & Plan:    CPX/v70.0 - Patient has been counseled on age-appropriate routine health concerns for screening and prevention. These are reviewed and up-to-date. Immunizations are up-to-date or declined. Labs have been and will be reviewed.   ADHD: Refill of ADDERALL, continue taking as prescribed  Constipation: Refill of Hemocyte, continue taking as prescribed  Patient will return to have labs drawn on Monday 11/04/13 and will drop off medical records then.

## 2013-11-12 ENCOUNTER — Ambulatory Visit: Payer: No Typology Code available for payment source | Admitting: Internal Medicine

## 2014-01-23 ENCOUNTER — Ambulatory Visit: Payer: No Typology Code available for payment source | Admitting: Physician Assistant

## 2014-01-23 DIAGNOSIS — Z0289 Encounter for other administrative examinations: Secondary | ICD-10-CM

## 2014-01-24 ENCOUNTER — Ambulatory Visit: Payer: No Typology Code available for payment source | Admitting: Internal Medicine

## 2014-01-24 DIAGNOSIS — Z0289 Encounter for other administrative examinations: Secondary | ICD-10-CM

## 2014-04-13 IMAGING — US US TRANSVAGINAL NON-OB
1 series · 14 of 25 positions shown · non-contrast
Comparison: None.

CLINICAL DATA: Menorrhagia



[Series 1: us pelvis complete · 14 of 49 slices shown]
[im 1/49]
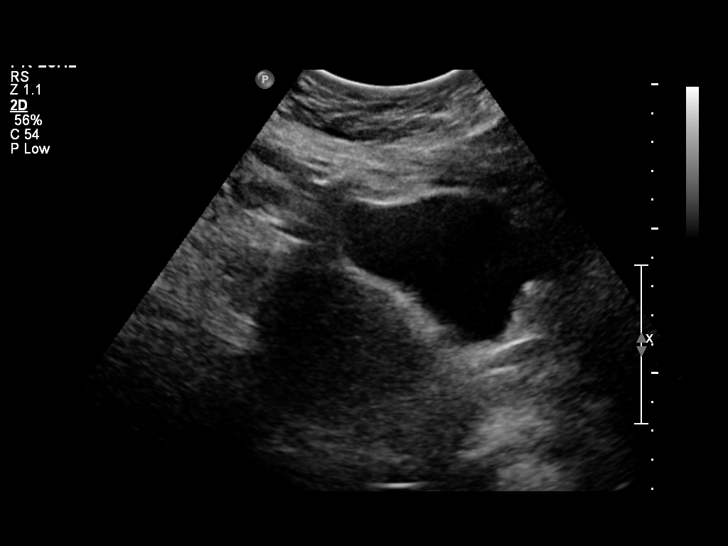
[im 5/49]
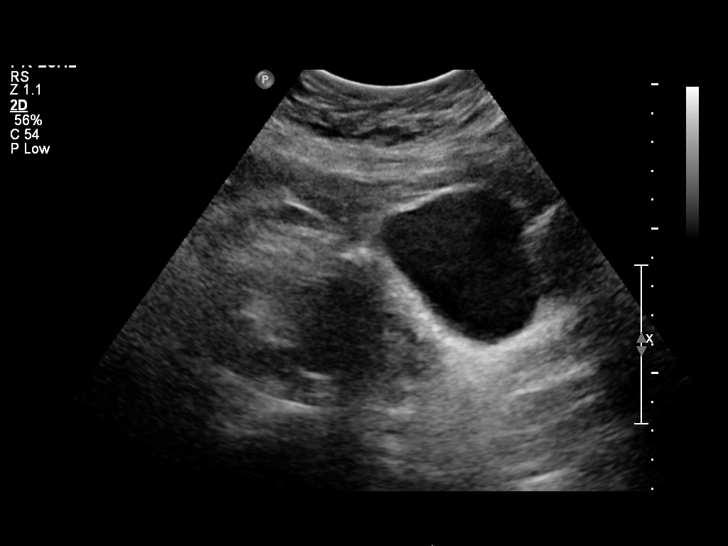
[im 9/49]
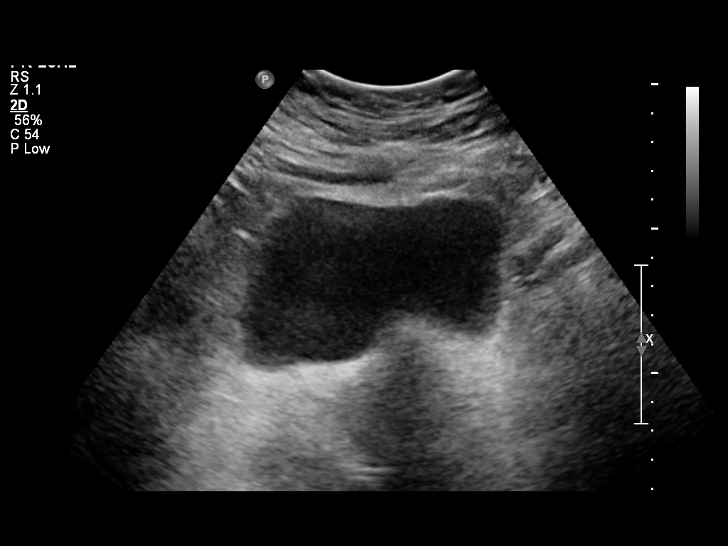
[im 13/49]
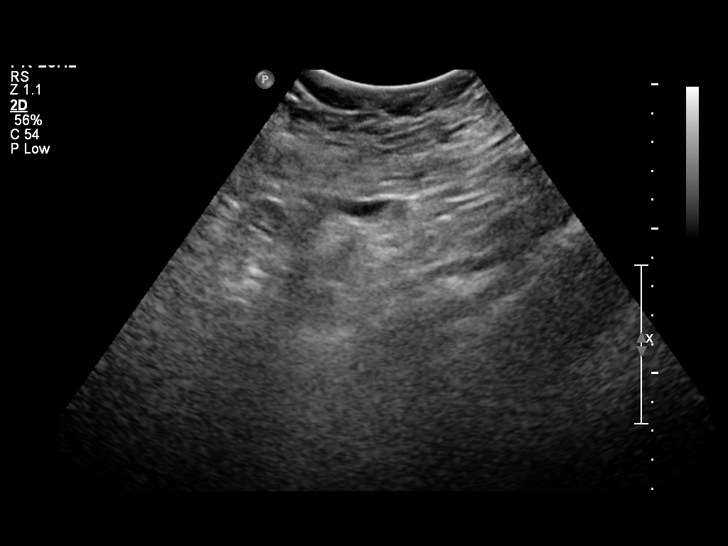
[im 17/49]
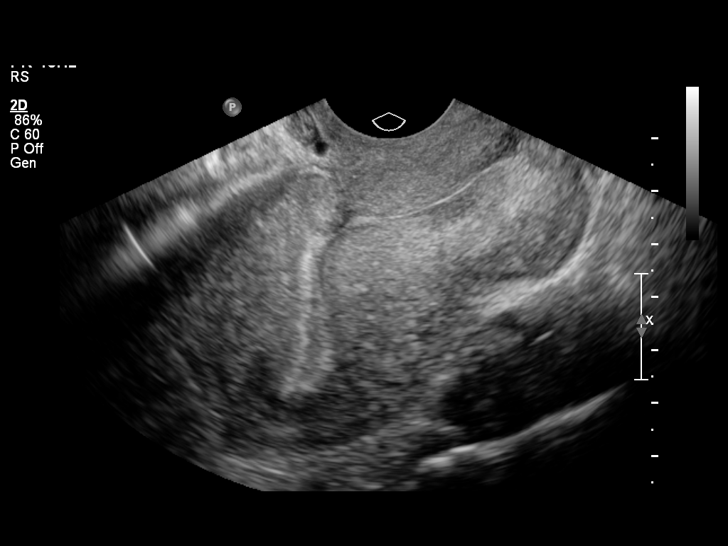
[im 19/49]
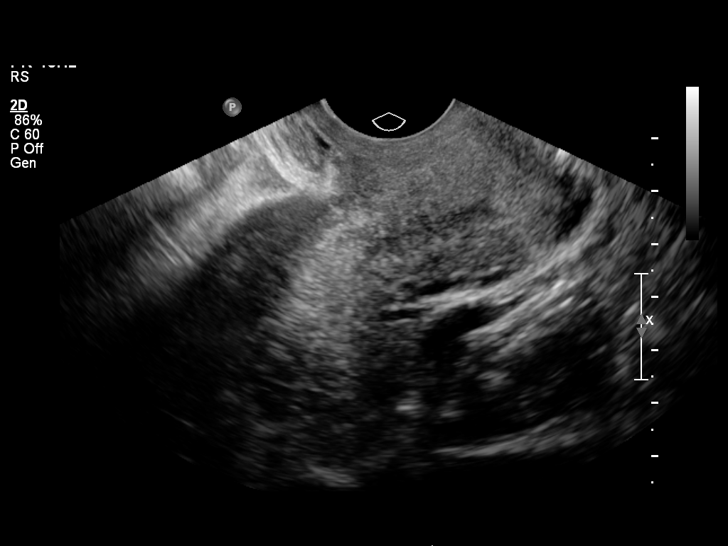
[im 23/49]
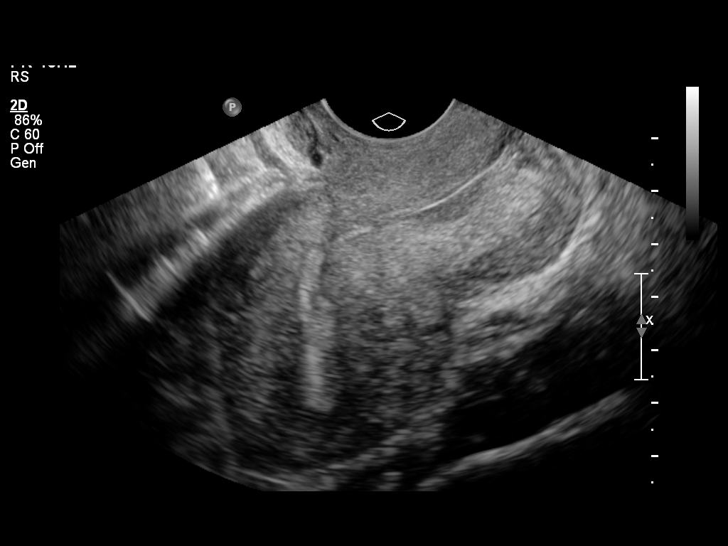
[im 27/49]
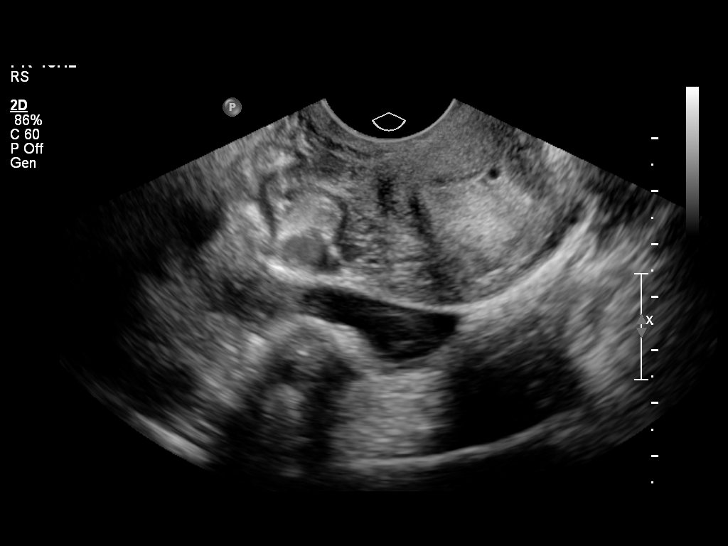
[im 31/49]
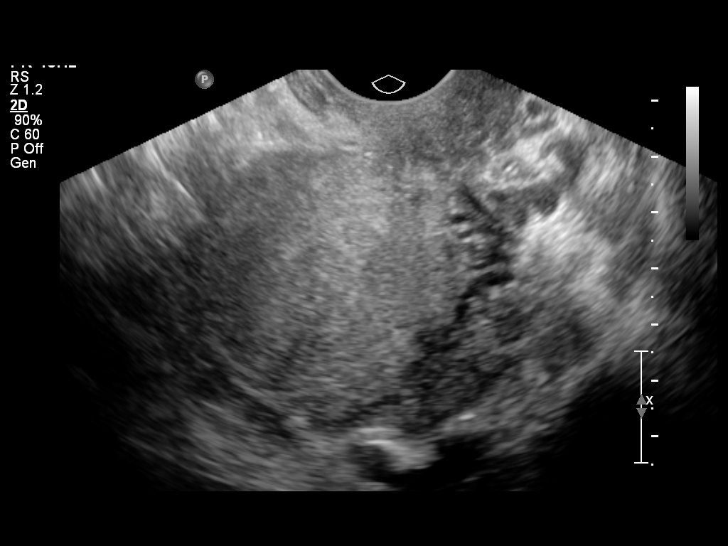
[im 33/49]
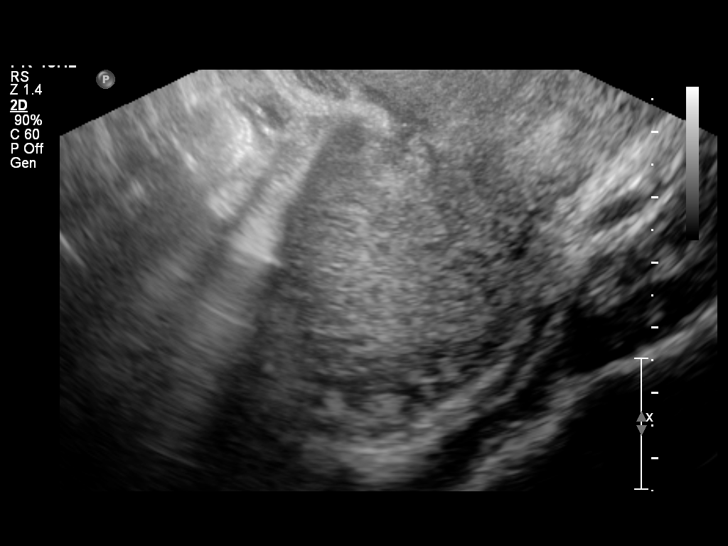
[im 37/49]
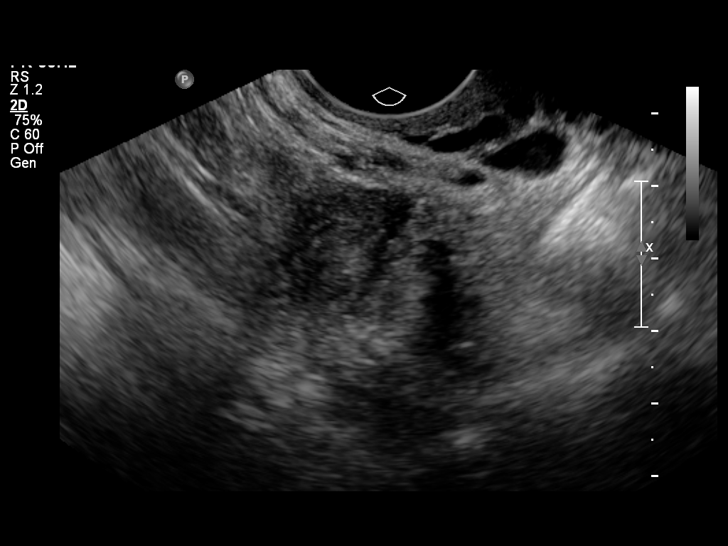
[im 41/49]
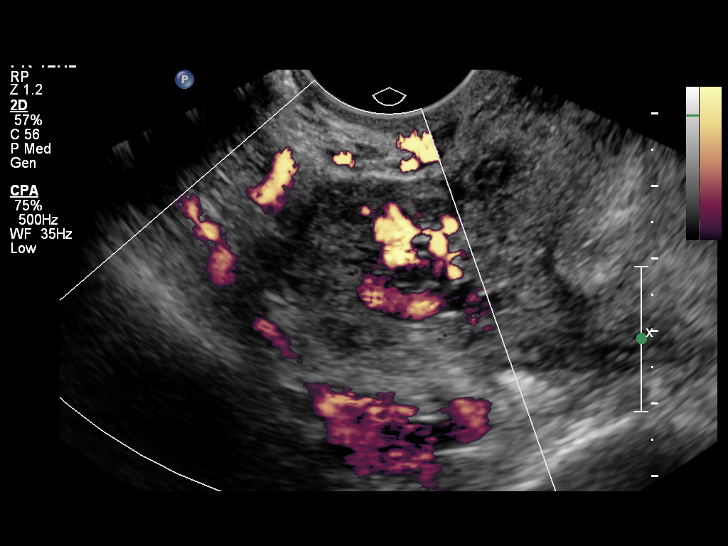
[im 45/49]
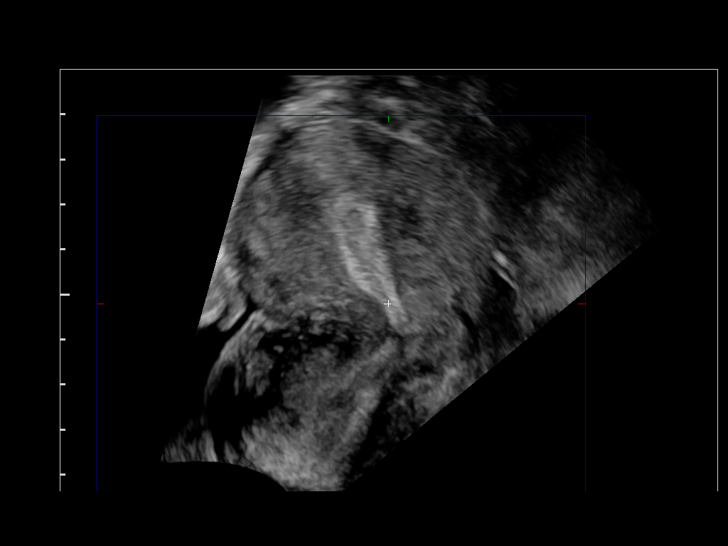
[im 49/49]
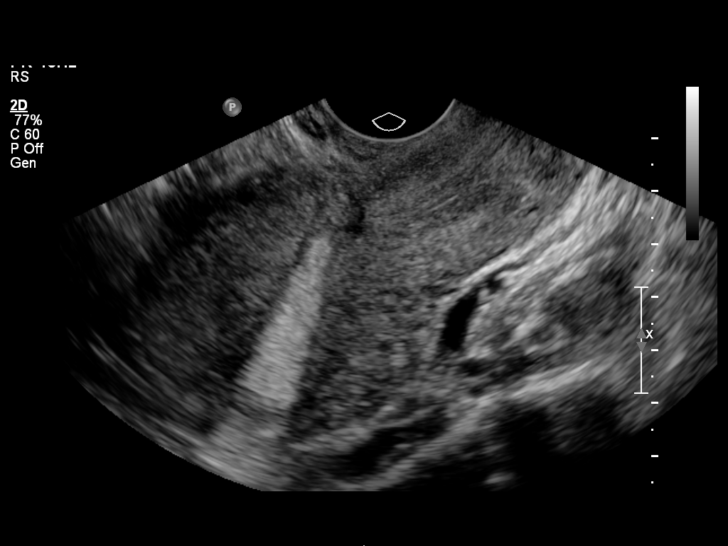

[14 of 25 positions shown; findings below may reference images not displayed]

FINDINGS: Uterus:  Anteverted, retroflexed.  This renders visualization of
the fundus suboptimal.  9.2 x 5.8 x 5.0 cm.  Normal.

Endometrium: 8 mm.  Uniformly echogenic without focal abnormality.

Right ovary: 2.1 x 1.3 x 1.2 cm.  Normal.

Left ovary: 3.0 x 1.0 x 1.6 cm.  Normal.

Other Findings:  No free fluid
IMPRESSION: Normal study.  No evidence of pelvic mass or other significant
abnormality.

## 2014-06-18 ENCOUNTER — Encounter: Payer: Self-pay | Admitting: Obstetrics and Gynecology

## 2014-06-18 ENCOUNTER — Ambulatory Visit (INDEPENDENT_AMBULATORY_CARE_PROVIDER_SITE_OTHER): Payer: No Typology Code available for payment source | Admitting: Obstetrics and Gynecology

## 2014-06-18 VITALS — BP 145/99 | HR 86 | Ht 61.0 in | Wt 165.8 lb

## 2014-06-18 DIAGNOSIS — Z3041 Encounter for surveillance of contraceptive pills: Secondary | ICD-10-CM

## 2014-06-18 MED ORDER — NORETHINDRONE 0.35 MG PO TABS: 1.0000 | ORAL_TABLET | Freq: Every day | ORAL | Status: DC

## 2014-06-18 NOTE — Progress Notes (Signed)
Patient ID: Katelyn Vargas, female   DOB: Apr 27, 1976, 38 y.o.   MRN: [id number] 38 yo with HTN here to change birth control pill. Patient has had a BTL and desires contraception to help regulate cycle. Discussed other methods but patient desires pill form. Patient is also due to physical exam which she will schedule soon  Rx Micronor provided

## 2014-07-24 ENCOUNTER — Other Ambulatory Visit: Payer: Self-pay | Admitting: *Deleted

## 2014-07-24 MED ORDER — NORGESTIMATE-ETH ESTRADIOL 0.25-35 MG-MCG PO TABS: 1.0000 | ORAL_TABLET | Freq: Every day | ORAL | Status: DC

## 2014-07-24 NOTE — Telephone Encounter (Signed)
Pharmacy request refill of ocp

## 2014-07-24 NOTE — Telephone Encounter (Signed)
Resent rx electronically, it printed out the first time.

## 2014-07-25 ENCOUNTER — Telehealth: Payer: Self-pay | Admitting: *Deleted

## 2014-07-25 DIAGNOSIS — B373 Candidiasis of vulva and vagina: Secondary | ICD-10-CM

## 2014-07-25 DIAGNOSIS — B3731 Acute candidiasis of vulva and vagina: Secondary | ICD-10-CM

## 2014-07-25 MED ORDER — FLUCONAZOLE 150 MG PO TABS: 150.0000 mg | ORAL_TABLET | Freq: Once | ORAL | Status: DC

## 2014-07-25 NOTE — Telephone Encounter (Signed)
Message copied by Barbara CowerNOGUES, Adia Crammer L on Fri Jul 25, 2014 11:05 AM ------      Message from: Pennie BanterSMITH, MARNI W      Created: Fri Jul 25, 2014 10:37 AM      Regarding: rx request       Patient thinks she has a yeast infection and would like a Rx called in for Diflucan [facility] [address]  ------

## 2014-07-31 ENCOUNTER — Ambulatory Visit (INDEPENDENT_AMBULATORY_CARE_PROVIDER_SITE_OTHER): Payer: No Typology Code available for payment source | Admitting: Obstetrics and Gynecology

## 2014-07-31 ENCOUNTER — Encounter: Payer: Self-pay | Admitting: Obstetrics and Gynecology

## 2014-07-31 VITALS — BP 141/92 | HR 80 | Ht 61.0 in | Wt 165.0 lb

## 2014-07-31 DIAGNOSIS — Z01419 Encounter for gynecological examination (general) (routine) without abnormal findings: Secondary | ICD-10-CM

## 2014-07-31 DIAGNOSIS — Z124 Encounter for screening for malignant neoplasm of cervix: Secondary | ICD-10-CM

## 2014-07-31 DIAGNOSIS — Z1151 Encounter for screening for human papillomavirus (HPV): Secondary | ICD-10-CM

## 2014-07-31 NOTE — Addendum Note (Signed)
Addended by: Tandy GawHINTON, Jaiden Wahab C on: 07/31/2014 10:04 AM   Modules accepted: Orders

## 2014-07-31 NOTE — Progress Notes (Signed)
  Subjective:     Katelyn Vargas is a 38 y.o. female 404P4 with BMI 31 who is here for a comprehensive physical exam. The patient reports no problems. Patient was prescribed micronor in September which she took without any issues. On refilling the prescription, patient was given Sprintec. Due to her HTN, Sprintec is not the best option. Patient is otherwise without complaints.   History   Social History  . Marital Status: Married    Spouse Name: N/A    Number of Children: N/A  . Years of Education: N/A   Occupational History  . Not on file.   Social History Main Topics  . Smoking status: Never Smoker   . Smokeless tobacco: Never Used  . Alcohol Use: Yes     Comment: social  . Drug Use: No  . Sexual Activity: Yes    Partners: Male    Birth Control/ Protection: Surgical   Other Topics Concern  . Not on file   Social History Narrative  . No narrative on file   Health Maintenance  Topic Date Due  . Pap Smear  08/24/1994  . Tetanus/tdap  08/25/1995  . Influenza Vaccine  01/01/2015 (Originally 05/03/2014)   Past Medical History  Diagnosis Date  . Anemia   . Heart murmur    Past Surgical History  Procedure Laterality Date  . Tubal ligation    . Eye surgery  08/04/2011    PRK  . Cesarean section  05/31/2004 08/09/2006    x2   Family History  Problem Relation Age of Onset  . Hypertension Mother   . Hyperlipidemia Mother   . Heart disease Mother   . Hyperlipidemia Father   . Seizures Daughter   . Heart disease Son     congential  . ADD / ADHD Son   . Hypertension Maternal Grandmother   . ADD / ADHD Son   . Hypertension Sister   . Mental illness Maternal Grandfather   . Dementia Paternal Grandfather        Review of Systems A comprehensive review of systems was negative.   Objective:      GENERAL: Well-developed, well-nourished female in no acute distress.  HEENT: Normocephalic, atraumatic. Sclerae anicteric.  NECK: Supple. Normal thyroid.  LUNGS: Clear to  auscultation bilaterally.  HEART: Regular rate and rhythm. BREASTS: Symmetric in size. No palpable masses or lymphadenopathy, skin changes, or nipple drainage. ABDOMEN: Soft, nontender, nondistended. No organomegaly. PELVIC: Normal external female genitalia. Vagina is pink and rugated.  Normal discharge. Normal appearing cervix. Uterus is normal in size. No adnexal mass or tenderness. EXTREMITIES: No cyanosis, clubbing, or edema, 2+ distal pulses.    Assessment:    Healthy female exam.      Plan:    pap smear collected Patient advised to perform monthly self breast and vulva exams Patient strongly advised to stop taking Sprintec and re-starting micronor recently prescribed Patient will be contacted with any abnormal results See After Visit Summary for Counseling Recommendations

## 2014-08-01 LAB — CYTOLOGY - PAP

## 2014-08-21 ENCOUNTER — Ambulatory Visit (INDEPENDENT_AMBULATORY_CARE_PROVIDER_SITE_OTHER): Payer: Managed Care, Other (non HMO) | Admitting: Obstetrics & Gynecology

## 2014-08-21 ENCOUNTER — Encounter: Payer: Self-pay | Admitting: Obstetrics & Gynecology

## 2014-08-21 VITALS — BP 144/98 | HR 83 | Wt 171.0 lb

## 2014-08-21 DIAGNOSIS — Z113 Encounter for screening for infections with a predominantly sexual mode of transmission: Secondary | ICD-10-CM

## 2014-08-21 DIAGNOSIS — R102 Pelvic and perineal pain: Secondary | ICD-10-CM

## 2014-08-21 NOTE — Addendum Note (Signed)
Addended by: Tandy GawHINTON, Welford Christmas C on: 08/21/2014 11:08 AM   Modules accepted: Orders

## 2014-08-21 NOTE — Progress Notes (Addendum)
   Subjective:    Patient ID: Katelyn Vargas, female    DOB: 02-Dec-1975, 38 y.o.   MRN: [id number]  HPI  38 yo M AA lady here today with a 3 day h/o difuse bilateral pelvic pain. She has no palliative or provocating issues. She has not had sex due to the discomfort. She denies fevers. Her period is due to start. She is using OCPs for contracetption. She is requesting STI testing.  Review of Systems Last pap normal    Objective:   Physical Exam No CVAT Benign abdomen exam Uterus at 6 week size I don't feel her adnexa       Assessment & Plan:  Pelvic pain- rec tylenol/IBU Schedule u/s

## 2014-08-21 NOTE — Addendum Note (Signed)
Addended by: Nicholaus BloomVE, Taylin Leder C on: 08/21/2014 11:08 AM   Modules accepted: Orders

## 2014-08-22 LAB — GC/CHLAMYDIA PROBE AMP
CT Probe RNA: NEGATIVE
GC PROBE AMP APTIMA: NEGATIVE

## 2014-08-22 LAB — WET PREP, GENITAL
Clue Cells Wet Prep HPF POC: NONE SEEN
Trich, Wet Prep: NONE SEEN
WBC, Wet Prep HPF POC: NONE SEEN
Yeast Wet Prep HPF POC: NONE SEEN

## 2014-08-22 LAB — HIV ANTIBODY (ROUTINE TESTING W REFLEX): HIV 1&2 Ab, 4th Generation: NONREACTIVE

## 2014-08-22 LAB — HEPATITIS B SURFACE ANTIGEN: HEP B S AG: NEGATIVE

## 2014-08-22 LAB — RPR

## 2014-08-22 LAB — HEPATITIS C ANTIBODY: HCV Ab: NEGATIVE

## 2014-08-26 ENCOUNTER — Ambulatory Visit (HOSPITAL_COMMUNITY)
Admission: RE | Admit: 2014-08-26 | Discharge: 2014-08-26 | Disposition: A | Discharge status: Home / Self Care | Payer: Managed Care, Other (non HMO) | Source: Ambulatory Visit | Attending: Obstetrics & Gynecology | Admitting: Obstetrics & Gynecology

## 2014-08-26 DIAGNOSIS — R102 Pelvic and perineal pain: Secondary | ICD-10-CM | POA: Insufficient documentation

## 2014-08-29 ENCOUNTER — Other Ambulatory Visit: Payer: Self-pay | Admitting: Obstetrics and Gynecology

## 2014-09-01 ENCOUNTER — Telehealth: Payer: Self-pay | Admitting: *Deleted

## 2014-09-01 NOTE — Telephone Encounter (Signed)
Patient is notified of lab results and normal ultrasound.

## 2014-09-15 ENCOUNTER — Encounter: Payer: Self-pay | Admitting: Obstetrics & Gynecology

## 2014-09-15 ENCOUNTER — Ambulatory Visit (INDEPENDENT_AMBULATORY_CARE_PROVIDER_SITE_OTHER): Payer: Managed Care, Other (non HMO) | Admitting: Obstetrics & Gynecology

## 2014-09-15 VITALS — BP 127/95 | HR 80 | Ht 61.0 in | Wt 168.4 lb

## 2014-09-15 DIAGNOSIS — N926 Irregular menstruation, unspecified: Secondary | ICD-10-CM

## 2014-09-15 NOTE — Progress Notes (Signed)
Follow up irregular bleeding.

## 2014-09-15 NOTE — Progress Notes (Signed)
   CLINIC ENCOUNTER NOTE  History:  38 y.o. F here today for evaluation of a prolonged episode of bleeding x 2 weeks that occurred two days after her normal period stopped.  Has been going through a lot of stress lately. No current bleeding.  On Micronor for period regulation (no estrogen due to HTN).  Had normal pelvic scan on 08/26/14; this was done for pelvic pain.  Normal TSH in 03/2013.  The following portions of the patient's history were reviewed and updated as appropriate: allergies, current medications, past family history, past medical history, past social history, past surgical history and problem list. Normal pap and negative HRHPV on 07/31/14.   Review of Systems:  Pertinent items are noted in HPI.  Objective:  BP 127/95 mmHg  Pulse 80  Ht 5\' 1"  (1.549 m)  Wt 168 lb 6.4 oz (76.386 kg)  BMI 31.84 kg/m2  LMP 08/21/2014 Physical Exam deferred  Labs and Imaging 08/26/2014   CLINICAL DATA:  Pelvic pain in female for past 2 weeks. LMP 08/21/14  EXAM: TRANSABDOMINAL AND TRANSVAGINAL ULTRASOUND OF PELVIS  TECHNIQUE: Both transabdominal and transvaginal ultrasound examinations of the pelvis were performed. Transabdominal technique was performed for global imaging of the pelvis including uterus, ovaries, adnexal regions, and pelvic cul-de-sac. It was necessary to proceed with endovaginal exam following the transabdominal exam to visualize the endometrial stripe and ovaries.  COMPARISON:  03/20/2013  FINDINGS: Uterus  Measurements: 9.2 x 5.8 x 7.2 cm. No fibroids or other mass visualized.  Endometrium  Thickness: 9 mm.  No focal abnormality visualized.  Right ovary  Measurements: 2.8 x 1.9 x 1.9 cm. Normal appearance/no adnexal mass.  Left ovary  Measurements: 2.9 x 2.1 x 1.9 cm. Normal appearance/no adnexal mass.  Other findings  Trace amount of free fluid in pelvic cul-de-sac.  IMPRESSION: Normal appearance of uterus and both ovaries. No pelvic mass or other significant abnormality  visualized.   Electronically Signed   By: Myles RosenthalJohn  Stahl M.D.   On: 08/26/2014 14:39    Assessment & Plan:  Abnormal bleeding episode likely secondary to stress, isolated incident for now. If continues, may need to change medication type/dosage or discuss other AUB management Patient reassured.  Routine preventative health maintenance measures emphasized.  Jaynie CollinsUGONNA  Kiarrah Rausch, MD, FACOG Attending Obstetrician & Gynecologist Center for [facility], [facility]

## 2014-09-15 NOTE — Patient Instructions (Signed)
Dysfunctional Uterine Bleeding Normally, menstrual periods begin between ages 11 to 17 in young women. A normal menstrual cycle/period may begin every 23 days up to 35 days and lasts from 1 to 7 days. Around 12 to 14 days before your menstrual period starts, ovulation (ovary produces an egg) occurs. When counting the time between menstrual periods, count from the first day of bleeding of the previous period to the first day of bleeding of the next period. Dysfunctional (abnormal) uterine bleeding is bleeding that is different from a normal menstrual period. Your periods may come earlier or later than usual. They may be lighter, have blood clots or be heavier. You may have bleeding between periods, or you may skip one period or more. You may have bleeding after sexual intercourse, bleeding after menopause, or no menstrual period. CAUSES   Pregnancy (normal, miscarriage, tubal).  IUDs (intrauterine device, birth control).  Birth control pills.  Hormone treatment.  Menopause.  Infection of the cervix.  Blood clotting problems.  Infection of the inside lining of the uterus.  Endometriosis, inside lining of the uterus growing in the pelvis and other female organs.  Adhesions (scar tissue) inside the uterus.  Obesity or severe weight loss.  Uterine polyps inside the uterus.  Cancer of the vagina, cervix, or uterus.  Ovarian cysts or polycystic ovary syndrome.  Medical problems (diabetes, thyroid disease).  Uterine fibroids (noncancerous tumor).  Problems with your female hormones.  Endometrial hyperplasia, very thick lining and enlarged cells inside of the uterus.  Medicines that interfere with ovulation.  Radiation to the pelvis or abdomen.  Chemotherapy. DIAGNOSIS   Your doctor will discuss the history of your menstrual periods, medicines you are taking, changes in your weight, stress in your life, and any medical problems you may have.  Your doctor will do a physical  and pelvic examination.  Your doctor may want to perform certain tests to make a diagnosis, such as:  Pap test.  Blood tests.  Cultures for infection.  CT scan.  Ultrasound.  Hysteroscopy.  Laparoscopy.  MRI.  Hysterosalpingography.  D and C.  Endometrial biopsy. TREATMENT  Treatment will depend on the cause of the dysfunctional uterine bleeding (DUB). Treatment may include:  Observing your menstrual periods for a couple of months.  Prescribing medicines for medical problems, including:  Antibiotics.  Hormones.  Birth control pills.  Removing an IUD (intrauterine device, birth control).  Surgery:  D and C (scrape and remove tissue from inside the uterus).  Laparoscopy (examine inside the abdomen with a lighted tube).  Uterine ablation (destroy lining of the uterus with electrical current, laser, heat, or freezing).  Hysteroscopy (examine cervix and uterus with a lighted tube).  Hysterectomy (remove the uterus). HOME CARE INSTRUCTIONS   If medicines were prescribed, take exactly as directed. Do not change or switch medicines without consulting your caregiver.  Long term heavy bleeding may result in iron deficiency. Your caregiver may have prescribed iron pills. They help replace the iron that your body lost from heavy bleeding. Take exactly as directed.  Do not take aspirin or medicines that contain aspirin one week before or during your menstrual period. Aspirin may make the bleeding worse.  If you need to change your sanitary pad or tampon more than once every 2 hours, stay in bed with your feet elevated and a cold pack on your lower abdomen. Rest as much as possible, until the bleeding stops or slows down.  Eat well-balanced meals. Eat foods high in iron. Examples   are:  Leafy green vegetables.  Whole-grain breads and cereals.  Eggs.  Meat.  Liver.  Do not try to lose weight until the abnormal bleeding has stopped and your blood iron level is  back to normal. Do not lift more than ten pounds or do strenuous activities when you are bleeding.  For a couple of months, make note on your calendar, marking the start and ending of your period, and the type of bleeding (light, medium, heavy, spotting, clots or missed periods). This is for your caregiver to better evaluate your problem. SEEK MEDICAL CARE IF:   You develop nausea (feeling sick to your stomach) and vomiting, dizziness, or diarrhea while you are taking your medicine.  You are getting lightheaded or weak.  You have any problems that may be related to the medicine you are taking.  You develop pain with your DUB.  You want to remove your IUD.  You want to stop or change your birth control pills or hormones.  You have any type of abnormal bleeding mentioned above.  You are over 16 years old and have not had a menstrual period yet.  You are 38 years old and you are still having menstrual periods.  You have any of the symptoms mentioned above.  You develop a rash. SEEK IMMEDIATE MEDICAL CARE IF:   An oral temperature above 102 F (38.9 C) develops.  You develop chills.  You are changing your sanitary pad or tampon more than once an hour.  You develop abdominal pain.  You pass out or faint. Document Released: 09/16/2000 Document Revised: 12/12/2011 Document Reviewed: 08/18/2009 Exit[employer] Patient Information 2015 [employer]. This information is not intended to replace advice given to you by your health care provider. Make sure you discuss any questions you have with your health care provider.  

## 2014-10-02 ENCOUNTER — Other Ambulatory Visit: Payer: Self-pay | Admitting: Gastroenterology

## 2015-03-12 ENCOUNTER — Other Ambulatory Visit: Payer: Self-pay | Admitting: Obstetrics and Gynecology

## 2015-07-16 ENCOUNTER — Emergency Department (HOSPITAL_COMMUNITY)
Admission: EM | Admit: 2015-07-16 | Discharge: 2015-07-16 | Disposition: A | Discharge status: Home / Self Care | Payer: Medicaid Other | Attending: Emergency Medicine | Admitting: Emergency Medicine

## 2015-07-16 ENCOUNTER — Encounter (HOSPITAL_COMMUNITY): Payer: Self-pay | Admitting: Emergency Medicine

## 2015-07-16 ENCOUNTER — Emergency Department (HOSPITAL_COMMUNITY): Payer: Medicaid Other

## 2015-07-16 DIAGNOSIS — Y9389 Activity, other specified: Secondary | ICD-10-CM | POA: Diagnosis not present

## 2015-07-16 DIAGNOSIS — Y9289 Other specified places as the place of occurrence of the external cause: Secondary | ICD-10-CM | POA: Diagnosis not present

## 2015-07-16 DIAGNOSIS — R011 Cardiac murmur, unspecified: Secondary | ICD-10-CM | POA: Diagnosis not present

## 2015-07-16 DIAGNOSIS — Z79899 Other long term (current) drug therapy: Secondary | ICD-10-CM | POA: Diagnosis not present

## 2015-07-16 DIAGNOSIS — Y998 Other external cause status: Secondary | ICD-10-CM | POA: Diagnosis not present

## 2015-07-16 DIAGNOSIS — S299XXA Unspecified injury of thorax, initial encounter: Secondary | ICD-10-CM | POA: Insufficient documentation

## 2015-07-16 DIAGNOSIS — D649 Anemia, unspecified: Secondary | ICD-10-CM | POA: Diagnosis not present

## 2015-07-16 DIAGNOSIS — S8391XA Sprain of unspecified site of right knee, initial encounter: Secondary | ICD-10-CM | POA: Diagnosis not present

## 2015-07-16 DIAGNOSIS — S86911A Strain of unspecified muscle(s) and tendon(s) at lower leg level, right leg, initial encounter: Secondary | ICD-10-CM | POA: Insufficient documentation

## 2015-07-16 DIAGNOSIS — S8991XA Unspecified injury of right lower leg, initial encounter: Secondary | ICD-10-CM | POA: Diagnosis present

## 2015-07-16 MED ORDER — IBUPROFEN 200 MG PO TABS: 600.0000 mg | ORAL_TABLET | Freq: Once | ORAL | Status: AC

## 2015-07-16 MED ORDER — HYDROCODONE-ACETAMINOPHEN 5-325 MG PO TABS
1.0000 | ORAL_TABLET | Freq: Once | ORAL | Status: AC
  Administered 2015-07-16: 1 via ORAL
  Filled 2015-07-16: qty 1

## 2015-07-16 MED ORDER — IBUPROFEN 600 MG PO TABS: 600.0000 mg | ORAL_TABLET | Freq: Three times a day (TID) | ORAL | Status: DC

## 2015-07-16 MED ORDER — IBUPROFEN 200 MG PO TABS
600.0000 mg | ORAL_TABLET | Freq: Once | ORAL | Status: AC
  Administered 2015-07-16: 600 mg via ORAL
  Filled 2015-07-16: qty 3

## 2015-07-16 MED ORDER — HYDROCODONE-ACETAMINOPHEN 5-325 MG PO TABS: 1.0000 | ORAL_TABLET | Freq: Four times a day (QID) | ORAL | Status: DC | PRN
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
## 2015-07-16 NOTE — Discharge Instructions (Signed)
Cryotherapy  Cryotherapy is when you put ice on your injury. Ice helps lessen pain and puffiness (swelling) after an injury. Ice works the best when you start using it in the first 24 to 48 hours after an injury.  HOME CARE  · Put a dry or damp towel between the ice pack and your skin.  · You may press gently on the ice pack.  · Leave the ice on for no more than 10 to 20 minutes at a time.  · Check your skin after 5 minutes to make sure your skin is okay.  · Rest at least 20 minutes between ice pack uses.  · Stop using ice when your skin loses feeling (numbness).  · Do not use ice on someone who cannot tell you when it hurts. This includes small children and people with memory problems (dementia).  GET HELP RIGHT AWAY IF:  · You have white spots on your skin.  · Your skin turns blue or pale.  · Your skin feels waxy or hard.  · Your puffiness gets worse.  MAKE SURE YOU:   · Understand these instructions.  · Will watch your condition.  · Will get help right away if you are not doing well or get worse.     This information is not intended to replace advice given to you by your health care provider. Make sure you discuss any questions you have with your health care provider.     Document Released: 03/07/2008 Document Revised: 12/12/2011 Document Reviewed: 05/12/2011  Elsevier Interactive Patient Education ©2016 Elsevier Inc.

## 2015-07-21 ENCOUNTER — Telehealth: Payer: Self-pay

## 2015-07-21 NOTE — Telephone Encounter (Signed)
Patient called wanting to know when her last "physical exam" was with Dr. Jolayne Pantheronstant. Informed her it was in October 2015. She wanted to know how to go about getting her records, I informed her she could either fill out a release and we physically hand the records to her, she could get another office to submit us the request for continuity of care, or she could sign up for myChart.She did not want to do any of those options.  Needed a referral to an orthopedic for a broken foot and she doesn't have a PCP. She referenced a comment she thought Dr. Jolayne Pantheronstant said to her about not needing a physical every year. It appears many attempts have been made to establish her care at family medicine office, but she constantly "no show" or "cancelled". Conversation ended unresolved, still very unclear if she actually wanted records, or a referral.

## 2015-10-10 ENCOUNTER — Emergency Department (HOSPITAL_COMMUNITY)
Admission: EM | Admit: 2015-10-10 | Discharge: 2015-10-10 | Disposition: A | Discharge status: Home / Self Care | Payer: Medicaid Other | Attending: Emergency Medicine | Admitting: Emergency Medicine

## 2015-10-10 ENCOUNTER — Emergency Department (HOSPITAL_COMMUNITY): Payer: Medicaid Other

## 2015-10-10 ENCOUNTER — Encounter (HOSPITAL_COMMUNITY): Payer: Self-pay

## 2015-10-10 DIAGNOSIS — R011 Cardiac murmur, unspecified: Secondary | ICD-10-CM | POA: Diagnosis not present

## 2015-10-10 DIAGNOSIS — S92321A Displaced fracture of second metatarsal bone, right foot, initial encounter for closed fracture: Secondary | ICD-10-CM | POA: Insufficient documentation

## 2015-10-10 DIAGNOSIS — S92331A Displaced fracture of third metatarsal bone, right foot, initial encounter for closed fracture: Secondary | ICD-10-CM | POA: Diagnosis not present

## 2015-10-10 DIAGNOSIS — Z79899 Other long term (current) drug therapy: Secondary | ICD-10-CM | POA: Diagnosis not present

## 2015-10-10 DIAGNOSIS — S92341A Displaced fracture of fourth metatarsal bone, right foot, initial encounter for closed fracture: Secondary | ICD-10-CM | POA: Insufficient documentation

## 2015-10-10 DIAGNOSIS — Y92410 Unspecified street and highway as the place of occurrence of the external cause: Secondary | ICD-10-CM | POA: Insufficient documentation

## 2015-10-10 DIAGNOSIS — Y998 Other external cause status: Secondary | ICD-10-CM | POA: Insufficient documentation

## 2015-10-10 DIAGNOSIS — D649 Anemia, unspecified: Secondary | ICD-10-CM | POA: Insufficient documentation

## 2015-10-10 DIAGNOSIS — S92301A Fracture of unspecified metatarsal bone(s), right foot, initial encounter for closed fracture: Secondary | ICD-10-CM

## 2015-10-10 DIAGNOSIS — Y9389 Activity, other specified: Secondary | ICD-10-CM | POA: Diagnosis not present

## 2015-10-10 DIAGNOSIS — S99921A Unspecified injury of right foot, initial encounter: Secondary | ICD-10-CM | POA: Diagnosis present

## 2015-10-10 MED ORDER — IBUPROFEN 800 MG PO TABS
800.0000 mg | ORAL_TABLET | Freq: Once | ORAL | Status: AC
  Administered 2015-10-10: 800 mg via ORAL
  Filled 2015-10-10: qty 1

## 2015-10-10 MED ORDER — OXYCODONE HCL 5 MG PO TABS
5.0000 mg | ORAL_TABLET | Freq: Once | ORAL | Status: AC
  Administered 2015-10-10: 5 mg via ORAL
  Filled 2015-10-10: qty 1

## 2015-10-10 MED ORDER — ACETAMINOPHEN 500 MG PO TABS
1000.0000 mg | ORAL_TABLET | Freq: Once | ORAL | Status: AC
  Administered 2015-10-10: 1000 mg via ORAL
  Filled 2015-10-10: qty 2

## 2015-10-10 MED ORDER — OXYCODONE HCL 5 MG PO TABS: 5.0000 mg | ORAL_TABLET | ORAL | Status: DC | PRN

## 2015-10-10 NOTE — Discharge Instructions (Signed)
Take 4 over the counter ibuprofen tablets 3 times a day or 2 over-the-counter naproxen tablets twice a day for pain.  Metatarsal Fracture A metatarsal fracture is a break in a metatarsal bone. Metatarsal bones connect your toe bones to your ankle bones. CAUSES This type of fracture may be caused by:  A sudden twisting of your foot.  A fall onto your foot.  Overuse or repetitive exercise. RISK FACTORS This condition is more likely to develop in people who:  Play contact sports.  Have a bone disease.  Have a low calcium level. SYMPTOMS Symptoms of this condition include:  Pain that is worse when walking or standing.  Pain when pressing on the foot or moving the toes.  Swelling.  Bruising on the top or bottom of the foot.  A foot that appears shorter than the other one. DIAGNOSIS This condition is diagnosed with a physical exam. You may also have imaging tests, such as:  X-rays.  A CT scan.  MRI. TREATMENT Treatment for this condition depends on its severity and whether a bone has moved out of place. Treatment may involve:  Rest.  Wearing foot support such as a cast, splint, or boot for several weeks.  Using crutches.  Surgery to move bones back into the right position. Surgery is usually needed if there are many pieces of broken bone or bones that are very out of place (displaced fracture).  Physical therapy. This may be needed to help you regain full movement and strength in your foot. You will need to return to your health care provider to have X-rays taken until your bones heal. Your health care provider will look at the X-rays to make sure that your foot is healing well. HOME CARE INSTRUCTIONS  If You Have a Cast:  Do not stick anything inside the cast to scratch your skin. Doing that increases your risk of infection.  Check the skin around the cast every day. Report any concerns to your health care provider. You may put lotion on dry skin around the edges  of the cast. Do not apply lotion to the skin underneath the cast.  Keep the cast clean and dry. If You Have a Splint or a Supportive Boot:  Wear it as directed by your health care provider. Remove it only as directed by your health care provider.  Loosen it if your toes become numb and tingle, or if they turn cold and blue.  Keep it clean and dry. Bathing  Do not take baths, swim, or use a hot tub until your health care provider approves. Ask your health care provider if you can take showers. You may only be allowed to take sponge baths for bathing.  If your health care provider approves bathing and showering, cover the cast or splint with a watertight plastic bag to protect it from water. Do not let the cast or splint get wet. Managing Pain, Stiffness, and Swelling  If directed, apply ice to the injured area (if you have a splint, not a cast).  Put ice in a plastic bag.  Place a towel between your skin and the bag.  Leave the ice on for 20 minutes, 2-3 times per day.  Move your toes often to avoid stiffness and to lessen swelling.  Raise (elevate) the injured area above the level of your heart while you are sitting or lying down. Driving  Do not drive or operate heavy machinery while taking pain medicine.  Do not drive while wearing foot  support on a foot that you use for driving. Activity  Return to your normal activities as directed by your health care provider. Ask your health care provider what activities are safe for you.  Perform exercises as directed by your health care provider or physical therapist. Safety  Do not use the injured foot to support your body weight until your health care provider says that you can. Use crutches as directed by your health care provider. General Instructions  Do not put pressure on any part of the cast or splint until it is fully hardened. This may take several hours.  Do not use any tobacco products, including cigarettes, chewing  tobacco, or e-cigarettes. Tobacco can delay bone healing. If you need help quitting, ask your health care provider.  Take medicines only as directed by your health care provider.  Keep all follow-up visits as directed by your health care provider. This is important. SEEK MEDICAL CARE IF:  You have a fever.  Your cast, splint, or boot is too loose or too tight.  Your cast, splint, or boot is damaged.  Your pain medicine is not helping.  You have pain, tingling, or numbness in your foot that is not going away. SEEK IMMEDIATE MEDICAL CARE IF:  You have severe pain.  You have tingling or numbness in your foot that is getting worse.  Your foot feels cold or becomes numb.  Your foot changes color.   This information is not intended to replace advice given to you by your health care provider. Make sure you discuss any questions you have with your health care provider.   Document Released: 06/11/2002 Document Revised: 02/03/2015 Document Reviewed: 07/16/2014 Elsevier Interactive Patient Education [employer]2016 Elsevier Inc.
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
## 2015-10-14 LAB — PROCEDURE REPORT - SCANNED: PAP SMEAR: NEGATIVE

## 2015-10-27 ENCOUNTER — Other Ambulatory Visit: Payer: Self-pay | Admitting: Orthopaedic Surgery

## 2015-10-27 DIAGNOSIS — S92301A Fracture of unspecified metatarsal bone(s), right foot, initial encounter for closed fracture: Secondary | ICD-10-CM

## 2015-10-29 ENCOUNTER — Ambulatory Visit
Admission: RE | Admit: 2015-10-29 | Discharge: 2015-10-29 | Disposition: A | Discharge status: Home / Self Care | Payer: Medicaid Other | Source: Ambulatory Visit | Attending: Orthopaedic Surgery | Admitting: Orthopaedic Surgery

## 2015-10-29 DIAGNOSIS — S92301A Fracture of unspecified metatarsal bone(s), right foot, initial encounter for closed fracture: Secondary | ICD-10-CM

## 2015-11-24 ENCOUNTER — Other Ambulatory Visit (HOSPITAL_COMMUNITY): Payer: Self-pay | Admitting: Orthopaedic Surgery

## 2015-11-24 DIAGNOSIS — M25561 Pain in right knee: Secondary | ICD-10-CM

## 2015-12-02 ENCOUNTER — Ambulatory Visit (HOSPITAL_COMMUNITY)
Admission: RE | Admit: 2015-12-02 | Discharge: 2015-12-02 | Disposition: A | Discharge status: Home / Self Care | Payer: Medicaid Other | Source: Ambulatory Visit | Attending: Orthopaedic Surgery | Admitting: Orthopaedic Surgery

## 2015-12-02 DIAGNOSIS — X58XXXA Exposure to other specified factors, initial encounter: Secondary | ICD-10-CM | POA: Diagnosis not present

## 2015-12-02 DIAGNOSIS — M25561 Pain in right knee: Secondary | ICD-10-CM | POA: Insufficient documentation

## 2015-12-02 DIAGNOSIS — S83271A Complex tear of lateral meniscus, current injury, right knee, initial encounter: Secondary | ICD-10-CM | POA: Insufficient documentation

## 2015-12-09 LAB — HM PAP SMEAR

## 2016-03-10 ENCOUNTER — Other Ambulatory Visit (INDEPENDENT_AMBULATORY_CARE_PROVIDER_SITE_OTHER): Payer: PRIVATE HEALTH INSURANCE

## 2016-03-10 ENCOUNTER — Ambulatory Visit (INDEPENDENT_AMBULATORY_CARE_PROVIDER_SITE_OTHER): Payer: PRIVATE HEALTH INSURANCE | Admitting: Internal Medicine

## 2016-03-10 ENCOUNTER — Encounter: Payer: Self-pay | Admitting: Internal Medicine

## 2016-03-10 VITALS — BP 118/86 | HR 67 | Temp 97.8°F | Resp 16 | Ht 63.0 in | Wt 179.0 lb

## 2016-03-10 DIAGNOSIS — E785 Hyperlipidemia, unspecified: Secondary | ICD-10-CM | POA: Diagnosis not present

## 2016-03-10 DIAGNOSIS — N910 Primary amenorrhea: Secondary | ICD-10-CM

## 2016-03-10 DIAGNOSIS — D509 Iron deficiency anemia, unspecified: Secondary | ICD-10-CM

## 2016-03-10 DIAGNOSIS — I10 Essential (primary) hypertension: Secondary | ICD-10-CM

## 2016-03-10 DIAGNOSIS — K5909 Other constipation: Secondary | ICD-10-CM | POA: Diagnosis not present

## 2016-03-10 DIAGNOSIS — E2839 Other primary ovarian failure: Secondary | ICD-10-CM | POA: Insufficient documentation

## 2016-03-10 DIAGNOSIS — E288 Other ovarian dysfunction: Secondary | ICD-10-CM

## 2016-03-10 LAB — COMPREHENSIVE METABOLIC PANEL
ALBUMIN: 4.3 g/dL (ref 3.5–5.2)
ALT: 12 U/L (ref 0–35)
AST: 20 U/L (ref 0–37)
Alkaline Phosphatase: 77 U/L (ref 39–117)
BUN: 12 mg/dL (ref 6–23)
CALCIUM: 9.8 mg/dL (ref 8.4–10.5)
CHLORIDE: 103 meq/L (ref 96–112)
CO2: 31 meq/L (ref 19–32)
Creatinine, Ser: 0.61 mg/dL (ref 0.40–1.20)
GFR: 140.03 mL/min (ref >60.00)
Glucose, Bld: 100 mg/dL — ABNORMAL HIGH (ref 70–99)
POTASSIUM: 3.8 meq/L (ref 3.5–5.1)
SODIUM: 139 meq/L (ref 135–145)
Total Bilirubin: 0.3 mg/dL (ref 0.2–1.2)
Total Protein: 7.7 g/dL (ref 6.0–8.3)

## 2016-03-10 LAB — LIPID PANEL
Cholesterol: 224 mg/dL — ABNORMAL HIGH (ref 0–200)
HDL: 74.4 mg/dL (ref >39.00)
LDL CALC: 134 mg/dL — AB (ref 0–99)
NonHDL: 149.46
Total CHOL/HDL Ratio: 3
Triglycerides: 75 mg/dL (ref 0.0–149.0)
VLDL: 15 mg/dL (ref 0.0–40.0)

## 2016-03-10 LAB — LUTEINIZING HORMONE: LH: 52.04 m[IU]/mL

## 2016-03-10 LAB — URINALYSIS, ROUTINE W REFLEX MICROSCOPIC
BILIRUBIN URINE: NEGATIVE
Hgb urine dipstick: NEGATIVE
KETONES UR: NEGATIVE
LEUKOCYTES UA: NEGATIVE
Nitrite: NEGATIVE
PH: 6 (ref 5.0–8.0)
SPECIFIC GRAVITY, URINE: 1.02 (ref 1.000–1.030)
TOTAL PROTEIN, URINE-UPE24: NEGATIVE
URINE GLUCOSE: NEGATIVE
UROBILINOGEN UA: 0.2 (ref 0.0–1.0)

## 2016-03-10 LAB — CBC WITH DIFFERENTIAL/PLATELET
BASOS PCT: 0.7 % (ref 0.0–3.0)
Basophils Absolute: 0 10*3/uL (ref 0.0–0.1)
EOS ABS: 0.1 10*3/uL (ref 0.0–0.7)
EOS PCT: 1.5 % (ref 0.0–5.0)
HEMATOCRIT: 38.9 % (ref 36.0–46.0)
HEMOGLOBIN: 12.7 g/dL (ref 12.0–15.0)
Lymphocytes Relative: 44.6 % (ref 12.0–46.0)
Lymphs Abs: 2.6 10*3/uL (ref 0.7–4.0)
MCHC: 32.6 g/dL (ref 30.0–36.0)
MCV: 81.7 fl (ref 78.0–100.0)
MONO ABS: 0.5 10*3/uL (ref 0.1–1.0)
Monocytes Relative: 8.9 % (ref 3.0–12.0)
NEUTROS ABS: 2.6 10*3/uL (ref 1.4–7.7)
Neutrophils Relative %: 44.3 % (ref 43.0–77.0)
PLATELETS: 325 10*3/uL (ref 150.0–400.0)
RBC: 4.76 Mil/uL (ref 3.87–5.11)
RDW: 15.9 % — AB (ref 11.5–15.5)
WBC: 5.9 10*3/uL (ref 4.0–10.5)

## 2016-03-10 LAB — IBC PANEL
IRON: 54 ug/dL (ref 42–145)
Saturation Ratios: 14.6 % — ABNORMAL LOW (ref 20.0–50.0)
TRANSFERRIN: 264 mg/dL (ref 212.0–360.0)

## 2016-03-10 LAB — FOLLICLE STIMULATING HORMONE: FSH: 95.2 m[IU]/mL

## 2016-03-10 LAB — FERRITIN: Ferritin: 16.5 ng/mL (ref 10.0–291.0)

## 2016-03-10 LAB — MAGNESIUM: MAGNESIUM: 2.1 mg/dL (ref 1.5–2.5)

## 2016-03-10 LAB — HCG, QUANTITATIVE, PREGNANCY: Quantitative HCG: 1.17 m[IU]/mL

## 2016-03-10 MED ORDER — NEBIVOLOL HCL 5 MG PO TABS
5.0000 mg | ORAL_TABLET | Freq: Every day | ORAL | Status: DC
Start: 2016-03-10 — End: 2016-04-21

## 2016-03-10 NOTE — Patient Instructions (Signed)
Hypertension Hypertension, commonly called high blood pressure, is when the force of blood pumping through your arteries is too strong. Your arteries are the blood vessels that carry blood from your heart throughout your body. A blood pressure reading consists of a higher number over a lower number, such as 110/72. The higher number (systolic) is the pressure inside your arteries when your heart pumps. The lower number (diastolic) is the pressure inside your arteries when your heart relaxes. Ideally you want your blood pressure below 120/80. Hypertension forces your heart to work harder to pump blood. Your arteries may become narrow or stiff. Having untreated or uncontrolled hypertension can cause heart attack, stroke, kidney disease, and other problems. RISK FACTORS Some risk factors for high blood pressure are controllable. Others are not.  Risk factors you cannot control include:   Race. You may be at higher risk if you are African American.  Age. Risk increases with age.  Gender. Men are at higher risk than women before age 45 years. After age 65, women are at higher risk than men. Risk factors you can control include:  Not getting enough exercise or physical activity.  Being overweight.  Getting too much fat, sugar, calories, or salt in your diet.  Drinking too much alcohol. SIGNS AND SYMPTOMS Hypertension does not usually cause signs or symptoms. Extremely high blood pressure (hypertensive crisis) may cause headache, anxiety, shortness of breath, and nosebleed. DIAGNOSIS To check if you have hypertension, your health care provider will measure your blood pressure while you are seated, with your arm held at the level of your heart. It should be measured at least twice using the same arm. Certain conditions can cause a difference in blood pressure between your right and left arms. A blood pressure reading that is higher than normal on one occasion does not mean that you need treatment. If  it is not clear whether you have high blood pressure, you may be asked to return on a different day to have your blood pressure checked again. Or, you may be asked to monitor your blood pressure at home for 1 or more weeks. TREATMENT Treating high blood pressure includes making lifestyle changes and possibly taking medicine. Living a healthy lifestyle can help lower high blood pressure. You may need to change some of your habits. Lifestyle changes may include:  Following the DASH diet. This diet is high in fruits, vegetables, and whole grains. It is low in salt, red meat, and added sugars.  Keep your sodium intake below 2,300 mg per day.  Getting at least 30-45 minutes of aerobic exercise at least 4 times per week.  Losing weight if necessary.  Not smoking.  Limiting alcoholic beverages.  Learning ways to reduce stress. Your health care provider may prescribe medicine if lifestyle changes are not enough to get your blood pressure under control, and if one of the following is true:  You are 18-59 years of age and your systolic blood pressure is above 140.  You are 60 years of age or older, and your systolic blood pressure is above 150.  Your diastolic blood pressure is above 90.  You have diabetes, and your systolic blood pressure is over 140 or your diastolic blood pressure is over 90.  You have kidney disease and your blood pressure is above 140/90.  You have heart disease and your blood pressure is above 140/90. Your personal target blood pressure may vary depending on your medical conditions, your age, and other factors. HOME CARE INSTRUCTIONS    Have your blood pressure rechecked as directed by your health care provider.   Take medicines only as directed by your health care provider. Follow the directions carefully. Blood pressure medicines must be taken as prescribed. The medicine does not work as well when you skip doses. Skipping doses also puts you at risk for  problems.  Do not smoke.   Monitor your blood pressure at home as directed by your health care provider. SEEK MEDICAL CARE IF:   You think you are having a reaction to medicines taken.  You have recurrent headaches or feel dizzy.  You have swelling in your ankles.  You have trouble with your vision. SEEK IMMEDIATE MEDICAL CARE IF:  You develop a severe headache or confusion.  You have unusual weakness, numbness, or feel faint.  You have severe chest or abdominal pain.  You vomit repeatedly.  You have trouble breathing. MAKE SURE YOU:   Understand these instructions.  Will watch your condition.  Will get help right away if you are not doing well or get worse.   This information is not intended to replace advice given to you by your health care provider. Make sure you discuss any questions you have with your health care provider.   Document Released: 09/19/2005 Document Revised: 02/03/2015 Document Reviewed: 07/12/2013 Elsevier Interactive Patient Education 2016 [employer]vier Inc.  

## 2016-03-10 NOTE — Progress Notes (Signed)
Pre visit review using our clinic review tool, if applicable. No additional management support is needed unless otherwise documented below in the visit note. 
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
## 2016-03-11 LAB — THYROID PANEL WITH TSH
FREE THYROXINE INDEX: 2.2 (ref 1.4–3.8)
T3 Uptake: 32 % (ref 22–35)
T4 TOTAL: 6.9 ug/dL (ref 4.5–12.0)
TSH: 0.92 m[IU]/L

## 2016-03-12 ENCOUNTER — Encounter: Payer: Self-pay | Admitting: Internal Medicine

## 2016-03-13 ENCOUNTER — Encounter: Payer: Self-pay | Admitting: Internal Medicine

## 2016-04-11 ENCOUNTER — Ambulatory Visit: Payer: PRIVATE HEALTH INSURANCE | Admitting: Internal Medicine

## 2016-04-21 ENCOUNTER — Encounter: Payer: Self-pay | Admitting: Internal Medicine

## 2016-04-21 ENCOUNTER — Ambulatory Visit (INDEPENDENT_AMBULATORY_CARE_PROVIDER_SITE_OTHER): Payer: PRIVATE HEALTH INSURANCE | Admitting: Internal Medicine

## 2016-04-21 VITALS — BP 142/96 | HR 72 | Temp 98.5°F | Resp 16 | Ht 63.0 in | Wt 183.5 lb

## 2016-04-21 DIAGNOSIS — E669 Obesity, unspecified: Secondary | ICD-10-CM

## 2016-04-21 DIAGNOSIS — D509 Iron deficiency anemia, unspecified: Secondary | ICD-10-CM

## 2016-04-21 DIAGNOSIS — E288 Other ovarian dysfunction: Secondary | ICD-10-CM

## 2016-04-21 DIAGNOSIS — I10 Essential (primary) hypertension: Secondary | ICD-10-CM | POA: Diagnosis not present

## 2016-04-21 DIAGNOSIS — E2839 Other primary ovarian failure: Secondary | ICD-10-CM

## 2016-04-21 MED ORDER — HEMOCYTE-F PO ELIX: 10.0000 mL | ORAL_SOLUTION | Freq: Every day | ORAL | Status: DC

## 2016-04-21 MED ORDER — LORCASERIN HCL ER 20 MG PO TB24: 1.0000 | ORAL_TABLET | Freq: Every day | ORAL | Status: DC

## 2016-04-21 MED ORDER — CHLORTHALIDONE 25 MG PO TABS: 25.0000 mg | ORAL_TABLET | Freq: Every day | ORAL | Status: DC
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
## 2016-04-21 NOTE — Progress Notes (Signed)
Pre visit review using our clinic review tool, if applicable. No additional management support is needed unless otherwise documented below in the visit note. 

## 2016-04-21 NOTE — Patient Instructions (Signed)
Hypertension Hypertension, commonly called high blood pressure, is when the force of blood pumping through your arteries is too strong. Your arteries are the blood vessels that carry blood from your heart throughout your body. A blood pressure reading consists of a higher number over a lower number, such as 110/72. The higher number (systolic) is the pressure inside your arteries when your heart pumps. The lower number (diastolic) is the pressure inside your arteries when your heart relaxes. Ideally you want your blood pressure below 120/80. Hypertension forces your heart to work harder to pump blood. Your arteries may become narrow or stiff. Having untreated or uncontrolled hypertension can cause heart attack, stroke, kidney disease, and other problems. RISK FACTORS Some risk factors for high blood pressure are controllable. Others are not.  Risk factors you cannot control include:   Race. You may be at higher risk if you are African American.  Age. Risk increases with age.  Gender. Men are at higher risk than women before age 45 years. After age 65, women are at higher risk than men. Risk factors you can control include:  Not getting enough exercise or physical activity.  Being overweight.  Getting too much fat, sugar, calories, or salt in your diet.  Drinking too much alcohol. SIGNS AND SYMPTOMS Hypertension does not usually cause signs or symptoms. Extremely high blood pressure (hypertensive crisis) may cause headache, anxiety, shortness of breath, and nosebleed. DIAGNOSIS To check if you have hypertension, your health care provider will measure your blood pressure while you are seated, with your arm held at the level of your heart. It should be measured at least twice using the same arm. Certain conditions can cause a difference in blood pressure between your right and left arms. A blood pressure reading that is higher than normal on one occasion does not mean that you need treatment. If  it is not clear whether you have high blood pressure, you may be asked to return on a different day to have your blood pressure checked again. Or, you may be asked to monitor your blood pressure at home for 1 or more weeks. TREATMENT Treating high blood pressure includes making lifestyle changes and possibly taking medicine. Living a healthy lifestyle can help lower high blood pressure. You may need to change some of your habits. Lifestyle changes may include:  Following the DASH diet. This diet is high in fruits, vegetables, and whole grains. It is low in salt, red meat, and added sugars.  Keep your sodium intake below 2,300 mg per day.  Getting at least 30-45 minutes of aerobic exercise at least 4 times per week.  Losing weight if necessary.  Not smoking.  Limiting alcoholic beverages.  Learning ways to reduce stress. Your health care provider may prescribe medicine if lifestyle changes are not enough to get your blood pressure under control, and if one of the following is true:  You are 18-59 years of age and your systolic blood pressure is above 140.  You are 60 years of age or older, and your systolic blood pressure is above 150.  Your diastolic blood pressure is above 90.  You have diabetes, and your systolic blood pressure is over 140 or your diastolic blood pressure is over 90.  You have kidney disease and your blood pressure is above 140/90.  You have heart disease and your blood pressure is above 140/90. Your personal target blood pressure may vary depending on your medical conditions, your age, and other factors. HOME CARE INSTRUCTIONS    Have your blood pressure rechecked as directed by your health care provider.   Take medicines only as directed by your health care provider. Follow the directions carefully. Blood pressure medicines must be taken as prescribed. The medicine does not work as well when you skip doses. Skipping doses also puts you at risk for  problems.  Do not smoke.   Monitor your blood pressure at home as directed by your health care provider. SEEK MEDICAL CARE IF:   You think you are having a reaction to medicines taken.  You have recurrent headaches or feel dizzy.  You have swelling in your ankles.  You have trouble with your vision. SEEK IMMEDIATE MEDICAL CARE IF:  You develop a severe headache or confusion.  You have unusual weakness, numbness, or feel faint.  You have severe chest or abdominal pain.  You vomit repeatedly.  You have trouble breathing. MAKE SURE YOU:   Understand these instructions.  Will watch your condition.  Will get help right away if you are not doing well or get worse.   This information is not intended to replace advice given to you by your health care provider. Make sure you discuss any questions you have with your health care provider.   Document Released: 09/19/2005 Document Revised: 02/03/2015 Document Reviewed: 07/12/2013 Elsevier Interactive Patient Education 2016 [employer]vier Inc.  

## 2016-04-22 ENCOUNTER — Telehealth: Payer: Self-pay

## 2016-04-22 DIAGNOSIS — D509 Iron deficiency anemia, unspecified: Secondary | ICD-10-CM

## 2016-04-22 MED ORDER — FERROUS FUMARATE-FOLIC ACID 324-1 MG PO TABS: 1.0000 | ORAL_TABLET | Freq: Every day | ORAL | Status: DC

## 2016-04-22 NOTE — Addendum Note (Signed)
Addended by: Etta GrandchildJONES, Trinna Kunst L on: 04/22/2016 01:48 PM   Modules accepted: Orders, Medications

## 2016-04-22 NOTE — Telephone Encounter (Signed)
Pharmacy informed of formula change

## 2016-04-22 NOTE — Telephone Encounter (Signed)
Please advise [facility] pharmacy called and stated that the medication that was sent in yesterday for iron does not come in an elixor. They would like to know what else they can do for the patient.

## 2016-04-22 NOTE — Telephone Encounter (Signed)
Changed to a tab

## 2016-04-28 ENCOUNTER — Telehealth: Payer: Self-pay | Admitting: *Deleted

## 2016-04-28 NOTE — Telephone Encounter (Signed)
PA initiated via CoverMyMeds key [id number]

## 2016-04-28 NOTE — Telephone Encounter (Signed)
Rec'd call pt is needing a PA on belviq. She states [facility] inform her that the MD need to contact insurance. Inform pt will forward msg to the person that does PA & she will give her call bck w/status...Raechel Chute

## 2016-04-29 NOTE — Telephone Encounter (Signed)
PA was Denied - Drug not covered/Plan Exclusion apply for weight loss medications. Please advise. Thanks!

## 2016-05-03 ENCOUNTER — Other Ambulatory Visit: Payer: Self-pay | Admitting: Orthopaedic Surgery

## 2016-05-03 DIAGNOSIS — M79671 Pain in right foot: Secondary | ICD-10-CM

## 2016-05-04 ENCOUNTER — Telehealth: Payer: Self-pay | Admitting: Internal Medicine

## 2016-05-04 NOTE — Telephone Encounter (Signed)
Pt request to speak to the assistant concern about medications. Please give her a call back

## 2016-05-05 NOTE — Telephone Encounter (Signed)
Talked with patient, she wants to know if liquid iron she already has will be ok to take until she runs out, then can she start on tablet ordered by dr jones---patient is not sure of mg dosage, I have advised patient to check dosage and call us back to let us know what dosage her current med is in liquid, then we need to route to dr Yetta Barre to see if he is ok with patient taking it until she starts tablet, can talk with tamara if any questions

## 2016-05-10 ENCOUNTER — Inpatient Hospital Stay: Admission: RE | Admit: 2016-05-10 | Payer: PRIVATE HEALTH INSURANCE | Source: Ambulatory Visit

## 2016-05-10 ENCOUNTER — Encounter: Payer: Self-pay | Admitting: Internal Medicine

## 2016-05-11 NOTE — Telephone Encounter (Signed)
Routing to dr jones, please advise, thanks 

## 2016-05-11 NOTE — Telephone Encounter (Signed)
Yes, that is okay.

## 2016-05-11 NOTE — Telephone Encounter (Signed)
Pt called back stating the dosage 220 mg per 5 mL.

## 2016-05-12 ENCOUNTER — Other Ambulatory Visit: Payer: Self-pay | Admitting: Internal Medicine

## 2016-05-12 DIAGNOSIS — D509 Iron deficiency anemia, unspecified: Secondary | ICD-10-CM

## 2016-05-12 MED ORDER — FERROUS SULFATE 220 (44 FE) MG/5ML PO ELIX: 220.0000 mg | ORAL_SOLUTION | Freq: Two times a day (BID) | ORAL | 11 refills | Status: AC

## 2016-05-12 NOTE — Telephone Encounter (Signed)
Left message advising pt that rx has been sent to pharm

## 2016-05-12 NOTE — Telephone Encounter (Signed)
RX sent

## 2016-05-12 NOTE — Telephone Encounter (Signed)
Patient does NOT currently have liquid form, BUT she is requesting liquid form be sent to her pharmacy---routing back to dr Yetta Barrejones requesting liquid form ---please advise, thanks

## 2016-05-16 ENCOUNTER — Ambulatory Visit (INDEPENDENT_AMBULATORY_CARE_PROVIDER_SITE_OTHER): Payer: PRIVATE HEALTH INSURANCE | Admitting: Obstetrics and Gynecology

## 2016-05-16 ENCOUNTER — Encounter: Payer: Self-pay | Admitting: Obstetrics and Gynecology

## 2016-05-16 VITALS — BP 109/72 | HR 67 | Resp 18 | Ht 61.0 in | Wt 189.0 lb

## 2016-05-16 DIAGNOSIS — E28319 Asymptomatic premature menopause: Secondary | ICD-10-CM | POA: Diagnosis not present

## 2016-05-16 NOTE — Progress Notes (Signed)
40 yo yo G4P4 presenting today for evaluation of hot flashes and other menopausal symptoms. Patient was diagnosed with HTN 5-6 months and started on an antihypertensive pill. Her birth control was discontinued. She states that she has been amenorrheic since. Her MD ran a blood work which revealed FSH and LH in postmenopausal range. She describes some hot flashes, mood swings, and weight gain.   Past Medical History:  Diagnosis Date  . Anemia   . Heart murmur    Past Surgical History:  Procedure Laterality Date  . CESAREAN SECTION  05/31/2004 08/09/2006   x2  . EYE SURGERY  08/04/2011   PRK  . TUBAL LIGATION     Family History  Problem Relation Age of Onset  . Hypertension Mother   . Hyperlipidemia Mother   . Heart disease Mother   . Hyperlipidemia Father   . Seizures Daughter   . Heart disease Son     congential  . ADD / ADHD Son   . Hypertension Maternal Grandmother   . ADD / ADHD Son   . Hypertension Sister   . Mental illness Maternal Grandfather   . Dementia Paternal Grandfather    Social History  Substance Use Topics  . Smoking status: Never Smoker  . Smokeless tobacco: Never Used  . Alcohol use No     Comment: social   ROS  See pertinent in HPI  Blood pressure 109/72, pulse 67, resp. rate 18, height 5\' 1"  (1.549 m), weight 189 lb (85.7 kg). GENERAL: Well-developed, well-nourished female in no acute distress.  EXTREMITIES: No cyanosis, clubbing, or edema, 2+ distal pulses.  A/P 40 yo with premature menopause - Results reviewed with the patient - Discussed behavioral changes with the patient to help with the symptoms which at this time are manageable - Discussed weight loss management strategies through diet and exercise - Encouraged the patient to continue antihypertensive as she does not believe that she has HTN - RTC prn

## 2016-05-25 ENCOUNTER — Encounter: Payer: Self-pay | Admitting: Obstetrics & Gynecology

## 2016-06-17 ENCOUNTER — Encounter: Payer: Self-pay | Admitting: Family

## 2016-06-17 ENCOUNTER — Ambulatory Visit (INDEPENDENT_AMBULATORY_CARE_PROVIDER_SITE_OTHER): Payer: BLUE CROSS/BLUE SHIELD | Admitting: Family

## 2016-06-17 ENCOUNTER — Ambulatory Visit (INDEPENDENT_AMBULATORY_CARE_PROVIDER_SITE_OTHER)
Admission: RE | Admit: 2016-06-17 | Discharge: 2016-06-17 | Disposition: A | Discharge status: Home / Self Care | Payer: BLUE CROSS/BLUE SHIELD | Source: Ambulatory Visit | Attending: Family | Admitting: Family

## 2016-06-17 VITALS — BP 108/80 | HR 77 | Temp 98.4°F | Resp 16 | Ht 63.0 in | Wt 178.0 lb

## 2016-06-17 DIAGNOSIS — J069 Acute upper respiratory infection, unspecified: Secondary | ICD-10-CM | POA: Diagnosis not present

## 2016-06-17 DIAGNOSIS — I10 Essential (primary) hypertension: Secondary | ICD-10-CM

## 2016-06-17 MED ORDER — CHLORTHALIDONE 25 MG PO TABS: 25.0000 mg | ORAL_TABLET | Freq: Every day | ORAL | 1 refills | Status: DC

## 2016-06-17 MED ORDER — CEFUROXIME AXETIL 250 MG PO TABS: 250.0000 mg | ORAL_TABLET | Freq: Two times a day (BID) | ORAL | 0 refills | Status: DC

## 2016-06-17 NOTE — Assessment & Plan Note (Signed)
Symptoms and exam consistent with acute upper respiratory infection. Recommend conservative treatment with over-the-counter medications as needed for symptom relief and supportive care. Written prescription for Ceftin provided with instructions for watchful waiting for the next 3-5 days. Obtain chest x-ray secondary to immunocompromised child and request of child's providers. Follow-up if symptoms worsen or do not improve.

## 2016-06-17 NOTE — Patient Instructions (Signed)
Thank you for choosing [facility].  SUMMARY AND INSTRUCTIONS:  Medication:  Your prescription(s) have been submitted to your pharmacy or been printed and provided for you. Please take as directed and contact our office if you believe you are having problem(s) with the medication(s) or have any questions.  Follow up:  If your symptoms worsen or fail to improve, please contact our office for further instruction, or in case of emergency go directly to the emergency room at the closest medical facility.    General Recommendations:    Please drink plenty of fluids.  Get plenty of rest   Sleep in humidified air  Use saline nasal sprays  Netti pot   OTC Medications:  Decongestants - helps relieve congestion   Flonase (generic fluticasone) or Nasacort (generic triamcinolone) - please make sure to use the "cross-over" technique at a 45 degree angle towards the opposite eye as opposed to straight up the nasal passageway.   Sudafed (generic pseudoephedrine - Note this is the one that is available behind the pharmacy counter); Products with phenylephrine (-PE) may also be used but is often not as effective as pseudoephedrine.   If you have HIGH BLOOD PRESSURE - Coricidin HBP; AVOID any product that is -D as this contains pseudoephedrine which may increase your blood pressure.  Afrin (oxymetazoline) every 6-8 hours for up to 3 days.   Allergies - helps relieve runny nose, itchy eyes and sneezing   Claritin (generic loratidine), Allegra (fexofenidine), or Zyrtec (generic cyrterizine) for runny nose. These medications should not cause drowsiness.  Note - Benadryl (generic diphenhydramine) may be used however may cause drowsiness  Cough -   Delsym or Robitussin (generic dextromethorphan)  Expectorants - helps loosen mucus to ease removal   Mucinex (generic guaifenesin) as directed on the package.  Headaches / General Aches   Tylenol (generic acetaminophen) - DO NOT  EXCEED 3 grams (3,000 mg) in a 24 hour time period  Advil/Motrin (generic ibuprofen)   Sore Throat -   Salt water gargle   Chloraseptic (generic benzocaine) spray or lozenges / Sucrets (generic dyclonine)       Upper Respiratory Infection, Adult Most upper respiratory infections (URIs) are a viral infection of the air passages leading to the lungs. A URI affects the nose, throat, and upper air passages. The most common type of URI is nasopharyngitis and is typically referred to as "the common cold." URIs run their course and usually go away on their own. Most of the time, a URI does not require medical attention, but sometimes a bacterial infection in the upper airways can follow a viral infection. This is called a secondary infection. Sinus and middle ear infections are common types of secondary upper respiratory infections. Bacterial pneumonia can also complicate a URI. A URI can worsen asthma and chronic obstructive pulmonary disease (COPD). Sometimes, these complications can require emergency medical care and may be life threatening.  CAUSES Almost all URIs are caused by viruses. A virus is a type of germ and can spread from one person to another.  RISKS FACTORS You may be at risk for a URI if:   You smoke.   You have chronic heart or lung disease.  You have a weakened defense (immune) system.   You are very young or very old.   You have nasal allergies or asthma.  You work in crowded or poorly ventilated areas.  You work in health care facilities or schools. SIGNS AND SYMPTOMS  Symptoms typically develop 2-3 days   after you come in contact with a cold virus. Most viral URIs last 7-10 days. However, viral URIs from the influenza virus (flu virus) can last 14-18 days and are typically more severe. Symptoms may include:   Runny or stuffy (congested) nose.   Sneezing.   Cough.   Sore throat.   Headache.   Fatigue.   Fever.   Loss of appetite.   Pain  in your forehead, behind your eyes, and over your cheekbones (sinus pain).  Muscle aches.  DIAGNOSIS  Your health care provider may diagnose a URI by:  Physical exam.  Tests to check that your symptoms are not due to another condition such as:  Strep throat.  Sinusitis.  Pneumonia.  Asthma. TREATMENT  A URI goes away on its own with time. It cannot be cured with medicines, but medicines may be prescribed or recommended to relieve symptoms. Medicines may help:  Reduce your fever.  Reduce your cough.  Relieve nasal congestion. HOME CARE INSTRUCTIONS   Take medicines only as directed by your health care provider.   Gargle warm saltwater or take cough drops to comfort your throat as directed by your health care provider.  Use a warm mist humidifier or inhale steam from a shower to increase air moisture. This may make it easier to breathe.  Drink enough fluid to keep your urine clear or pale yellow.   Eat soups and other clear broths and maintain good nutrition.   Rest as needed.   Return to work when your temperature has returned to normal or as your health care provider advises. You may need to stay home longer to avoid infecting others. You can also use a face mask and careful hand washing to prevent spread of the virus.  Increase the usage of your inhaler if you have asthma.   Do not use any tobacco products, including cigarettes, chewing tobacco, or electronic cigarettes. If you need help quitting, ask your health care provider. PREVENTION  The best way to protect yourself from getting a cold is to practice good hygiene.   Avoid oral or hand contact with people with cold symptoms.   Wash your hands often if contact occurs.  There is no clear evidence that vitamin C, vitamin E, echinacea, or exercise reduces the chance of developing a cold. However, it is always recommended to get plenty of rest, exercise, and practice good nutrition.  SEEK MEDICAL CARE IF:    You are getting worse rather than better.   Your symptoms are not controlled by medicine.   You have chills.  You have worsening shortness of breath.  You have brown or red mucus.  You have yellow or brown nasal discharge.  You have pain in your face, especially when you bend forward.  You have a fever.  You have swollen neck glands.  You have pain while swallowing.  You have white areas in the back of your throat. SEEK IMMEDIATE MEDICAL CARE IF:   You have severe or persistent:  Headache.  Ear pain.  Sinus pain.  Chest pain.  You have chronic lung disease and any of the following:  Wheezing.  Prolonged cough.  Coughing up blood.  A change in your usual mucus.  You have a stiff neck.  You have changes in your:  Vision.  Hearing.  Thinking.  Mood. MAKE SURE YOU:   Understand these instructions.  Will watch your condition.  Will get help right away if you are not doing well or get worse.     This information is not intended to replace advice given to you by your health care provider. Make sure you discuss any questions you have with your health care provider.   Document Released: 03/15/2001 Document Revised: 02/03/2015 Document Reviewed: 12/25/2013 Elsevier Interactive Patient Education 2016 Elsevier Inc.   

## 2016-06-17 NOTE — Progress Notes (Signed)
Subjective:    Patient ID: Katelyn DroneIkeisha Vargas, female    DOB: [birth year], 40 y.o.   MRN: [id number]  Chief Complaint  Patient presents with  . Sore Throat    x3 days, productive cough, had a positive tb test in 2011 and was told to get chest xray to make sure she is negative    HPI:  Katelyn Dronekeisha Mccleod is a 40 y.o. female who  has a past medical history of Anemia; Heart murmur; and Hypertension. and presents today for an acute office visit.   This is a new problem. Associated symptom of productive cough and sore throat has been going on for about about 3 days. She was noted to have a positive TB test in 2011. It was advised that she have a chest x-ray to rule out TB. Denies any fevers, chills, night sweats, or weight changes. Modifying factors include garggling with salt water and mouth wash. Course of the symptoms are staying about the same. Has a child that is immunocompromised secondary to a heart condition and lack of spleen.   No Known Allergies    Outpatient Medications Prior to Visit  Medication Sig Dispense Refill  . ferrous sulfate 220 (44 Fe) MG/5ML solution Take 5 mLs (220 mg total) by mouth 2 (two) times daily with a meal. (Patient not taking: Reported on 05/16/2016) 473 mL 11  . Lorcaserin HCl ER (BELVIQ XR) 20 MG TB24 Take 1 tablet by mouth daily. (Patient not taking: Reported on 05/16/2016) 30 tablet 5  . lubiprostone (AMITIZA) 24 MCG capsule Take 1 capsule (24 mcg total) by mouth daily with breakfast. 30 capsule 3  . chlorthalidone (HYGROTON) 25 MG tablet Take 1 tablet (25 mg total) by mouth daily. 90 tablet 1   No facility-administered medications prior to visit.       Past Surgical History:  Procedure Laterality Date  . CESAREAN SECTION  05/31/2004 08/09/2006   x2  . EYE SURGERY  08/04/2011   PRK  . TUBAL LIGATION        Past Medical History:  Diagnosis Date  . Anemia   . Heart murmur   . Hypertension     Review of Systems  Constitutional: Negative for  chills, fever and unexpected weight change.  HENT: Positive for congestion and sore throat. Negative for ear pain, sinus pressure and sneezing.   Respiratory: Positive for cough. Negative for chest tightness and shortness of breath.   Neurological: Negative for headaches.      Objective:    BP 108/80 (BP Location: Left Arm, Patient Position: Sitting, Cuff Size: Normal)   Pulse 77   Temp 98.4 F (36.9 C) (Oral)   Resp 16   Ht 5\' 3"  (1.6 m)   Wt 178 lb (80.7 kg)   SpO2 97%   BMI 31.53 kg/m  Nursing note and vital signs reviewed.  Physical Exam  Constitutional: She is oriented to person, place, and time. She appears well-developed and well-nourished. No distress.  HENT:  Right Ear: Hearing, tympanic membrane, external ear and ear canal normal.  Left Ear: Hearing, tympanic membrane, external ear and ear canal normal.  Nose: Nose normal.  Mouth/Throat: Uvula is midline, oropharynx is clear and moist and mucous membranes are normal.  Tonsils 2+  Cardiovascular: Normal rate, regular rhythm, normal heart sounds and intact distal pulses.   Pulmonary/Chest: Effort normal and breath sounds normal.  Neurological: She is alert and oriented to person, place, and time.  Skin: Skin is warm and dry.  Psychiatric:  She has a normal mood and affect. Her behavior is normal. Judgment and thought content normal.       Assessment & Plan:   Problem List Items Addressed This Visit      Cardiovascular and Mediastinum   Essential hypertension   Relevant Medications   chlorthalidone (HYGROTON) 25 MG tablet     Respiratory   Acute upper respiratory infection - Primary    Symptoms and exam consistent with acute upper respiratory infection. Recommend conservative treatment with over-the-counter medications as needed for symptom relief and supportive care. Written prescription for Ceftin provided with instructions for watchful waiting for the next 3-5 days. Obtain chest x-ray secondary to  immunocompromised child and request of child's providers. Follow-up if symptoms worsen or do not improve.      Relevant Medications   cefUROXime (CEFTIN) 250 MG tablet   Other Relevant Orders   DG Chest 2 View (Completed)    Other Visit Diagnoses   None.      I am having Ms. Lafrance start on cefUROXime. I am also having her maintain her lubiprostone, Lorcaserin HCl ER, ferrous sulfate, and chlorthalidone.   Meds ordered this encounter  Medications  . chlorthalidone (HYGROTON) 25 MG tablet    Sig: Take 1 tablet (25 mg total) by mouth daily.    Dispense:  90 tablet    Refill:  1    Order Specific Question:   Supervising Provider    Answer:   Hillard Danker A [[id number]]  . cefUROXime (CEFTIN) 250 MG tablet    Sig: Take 1 tablet (250 mg total) by mouth 2 (two) times daily with a meal.    Dispense:  20 tablet    Refill:  0    Order Specific Question:   Supervising Provider    Answer:   Hillard Danker A [[id number]]     Follow-up: Return if symptoms worsen or fail to improve.  Jeanine Luz, FNP

## 2016-06-23 ENCOUNTER — Encounter: Payer: PRIVATE HEALTH INSURANCE | Admitting: Obstetrics & Gynecology

## 2016-06-23 ENCOUNTER — Ambulatory Visit: Payer: PRIVATE HEALTH INSURANCE | Admitting: Internal Medicine

## 2016-06-28 ENCOUNTER — Encounter: Payer: Self-pay | Admitting: *Deleted

## 2016-07-05 ENCOUNTER — Ambulatory Visit: Payer: BLUE CROSS/BLUE SHIELD | Admitting: Internal Medicine

## 2016-07-13 ENCOUNTER — Ambulatory Visit: Payer: BLUE CROSS/BLUE SHIELD | Admitting: Internal Medicine

## 2016-07-14 ENCOUNTER — Telehealth: Payer: Self-pay | Admitting: Internal Medicine

## 2016-07-14 NOTE — Telephone Encounter (Signed)
Patient no showed for follow up on 10/11.  Please advise.

## 2016-12-08 ENCOUNTER — Other Ambulatory Visit: Payer: Self-pay | Admitting: Family

## 2016-12-08 DIAGNOSIS — I10 Essential (primary) hypertension: Secondary | ICD-10-CM

## 2016-12-12 ENCOUNTER — Encounter: Payer: Self-pay | Admitting: Internal Medicine

## 2016-12-12 ENCOUNTER — Other Ambulatory Visit (INDEPENDENT_AMBULATORY_CARE_PROVIDER_SITE_OTHER): Payer: BLUE CROSS/BLUE SHIELD

## 2016-12-12 ENCOUNTER — Ambulatory Visit (INDEPENDENT_AMBULATORY_CARE_PROVIDER_SITE_OTHER)
Admission: RE | Admit: 2016-12-12 | Discharge: 2016-12-12 | Disposition: A | Discharge status: Home / Self Care | Payer: BLUE CROSS/BLUE SHIELD | Source: Ambulatory Visit | Attending: Internal Medicine | Admitting: Internal Medicine

## 2016-12-12 ENCOUNTER — Ambulatory Visit (INDEPENDENT_AMBULATORY_CARE_PROVIDER_SITE_OTHER): Payer: BLUE CROSS/BLUE SHIELD | Admitting: Internal Medicine

## 2016-12-12 VITALS — BP 130/88 | HR 75 | Temp 98.3°F | Resp 16 | Ht 63.0 in | Wt 193.2 lb

## 2016-12-12 DIAGNOSIS — Z1231 Encounter for screening mammogram for malignant neoplasm of breast: Secondary | ICD-10-CM

## 2016-12-12 DIAGNOSIS — K5904 Chronic idiopathic constipation: Secondary | ICD-10-CM

## 2016-12-12 DIAGNOSIS — I10 Essential (primary) hypertension: Secondary | ICD-10-CM | POA: Diagnosis not present

## 2016-12-12 DIAGNOSIS — N912 Amenorrhea, unspecified: Secondary | ICD-10-CM

## 2016-12-12 DIAGNOSIS — R0782 Intercostal pain: Secondary | ICD-10-CM | POA: Diagnosis not present

## 2016-12-12 LAB — CBC WITH DIFFERENTIAL/PLATELET
BASOS ABS: 0 10*3/uL (ref 0.0–0.1)
Basophils Relative: 0.7 % (ref 0.0–3.0)
Eosinophils Absolute: 0.2 10*3/uL (ref 0.0–0.7)
Eosinophils Relative: 2.6 % (ref 0.0–5.0)
HCT: 39.8 % (ref 36.0–46.0)
Hemoglobin: 12.7 g/dL (ref 12.0–15.0)
LYMPHS ABS: 2.6 10*3/uL (ref 0.7–4.0)
Lymphocytes Relative: 40.7 % (ref 12.0–46.0)
MCHC: 31.9 g/dL (ref 30.0–36.0)
MCV: 83.3 fl (ref 78.0–100.0)
MONO ABS: 0.6 10*3/uL (ref 0.1–1.0)
Monocytes Relative: 8.9 % (ref 3.0–12.0)
NEUTROS PCT: 47.1 % (ref 43.0–77.0)
Neutro Abs: 3 10*3/uL (ref 1.4–7.7)
Platelets: 372 10*3/uL (ref 150.0–400.0)
RBC: 4.77 Mil/uL (ref 3.87–5.11)
RDW: 14.3 % (ref 11.5–15.5)
WBC: 6.3 10*3/uL (ref 4.0–10.5)

## 2016-12-12 LAB — URINALYSIS, ROUTINE W REFLEX MICROSCOPIC
Bilirubin Urine: NEGATIVE
Hgb urine dipstick: NEGATIVE
KETONES UR: NEGATIVE
Leukocytes, UA: NEGATIVE
Nitrite: NEGATIVE
SPECIFIC GRAVITY, URINE: 1.015 (ref 1.000–1.030)
Total Protein, Urine: NEGATIVE
UROBILINOGEN UA: 0.2 (ref 0.0–1.0)
Urine Glucose: NEGATIVE
pH: 6.5 (ref 5.0–8.0)

## 2016-12-12 LAB — COMPREHENSIVE METABOLIC PANEL
ALK PHOS: 79 U/L (ref 39–117)
ALT: 15 U/L (ref 0–35)
AST: 23 U/L (ref 0–37)
Albumin: 4.2 g/dL (ref 3.5–5.2)
BILIRUBIN TOTAL: 0.3 mg/dL (ref 0.2–1.2)
BUN: 11 mg/dL (ref 6–23)
CALCIUM: 10 mg/dL (ref 8.4–10.5)
CO2: 30 mEq/L (ref 19–32)
CREATININE: 0.58 mg/dL (ref 0.40–1.20)
Chloride: 104 mEq/L (ref 96–112)
GFR: 147.85 mL/min (ref >60.00)
GLUCOSE: 79 mg/dL (ref 70–99)
Potassium: 3.9 mEq/L (ref 3.5–5.1)
Sodium: 140 mEq/L (ref 135–145)
Total Protein: 8 g/dL (ref 6.0–8.3)

## 2016-12-12 MED ORDER — LUBIPROSTONE 24 MCG PO CAPS: 24.0000 ug | ORAL_CAPSULE | Freq: Every day | ORAL | 1 refills | Status: AC

## 2016-12-12 MED ORDER — ETODOLAC ER 400 MG PO TB24: 400.0000 mg | ORAL_TABLET | Freq: Every day | ORAL | 0 refills | Status: AC

## 2016-12-12 NOTE — Patient Instructions (Signed)
Chest Wall Pain °Chest wall pain is pain in or around the bones and muscles of your chest. Sometimes, an injury causes this pain. Sometimes, the cause may not be known. This pain may take several weeks or longer to get better. °Follow these instructions at home: °Pay attention to any changes in your symptoms. Take these actions to help with your pain: °· Rest as told by your health care provider. °· Avoid activities that cause pain. These include any activities that use your chest muscles or your abdominal and side muscles to lift heavy items. °· If directed, apply ice to the painful area: °¨ Put ice in a plastic bag. °¨ Place a towel between your skin and the bag. °¨ Leave the ice on for 20 minutes, 2-3 times per day. °· Take over-the-counter and prescription medicines only as told by your health care provider. °· Do not use tobacco products, including cigarettes, chewing tobacco, and e-cigarettes. If you need help quitting, ask your health care provider. °· Keep all follow-up visits as told by your health care provider. This is important. °Contact a health care provider if: °· You have a fever. °· Your chest pain becomes worse. °· You have new symptoms. °Get help right away if: °· You have nausea or vomiting. °· You feel sweaty or light-headed. °· You have a cough with phlegm (sputum) or you cough up blood. °· You develop shortness of breath. °This information is not intended to replace advice given to you by your health care provider. Make sure you discuss any questions you have with your health care provider. °Document Released: 09/19/2005 Document Revised: 01/28/2016 Document Reviewed: 12/15/2014 °Elsevier Interactive Patient Education © 2017 Elsevier Inc. ° °

## 2016-12-12 NOTE — Progress Notes (Signed)
Pre visit review using our clinic review tool, if applicable. No additional management support is needed unless otherwise documented below in the visit note. 

## 2016-12-12 NOTE — Progress Notes (Signed)
Subjective:  Patient ID: Katelyn Vargas, female    DOB: May 22, 1976  Age: 41 y.o. MRN: [id number]  CC: Hypertension   HPI Katelyn Vargas presents for a BP check - She complains for the last 3 months that she has had pain on both lateral sides of her chest wall. She describes it as a soreness that is exacerbated by palpation and movement. She denies cough, pleuritic symptoms, night sweats, rash, lymphadenopathy, swelling, or abdominal pain.  She has not had a menstrual cycle for 3 months.  Outpatient Medications Prior to Visit  Medication Sig Dispense Refill  . chlorthalidone (HYGROTON) 25 MG tablet Take 1 tablet (25 mg total) by mouth daily. 90 tablet 1  . ferrous sulfate 220 (44 Fe) MG/5ML solution Take 5 mLs (220 mg total) by mouth 2 (two) times daily with a meal. 473 mL 11  . lubiprostone (AMITIZA) 24 MCG capsule Take 1 capsule (24 mcg total) by mouth daily with breakfast. 30 capsule 3  . cefUROXime (CEFTIN) 250 MG tablet Take 1 tablet (250 mg total) by mouth 2 (two) times daily with a meal. 20 tablet 0  . Lorcaserin HCl ER (BELVIQ XR) 20 MG TB24 Take 1 tablet by mouth daily. (Patient not taking: Reported on 05/16/2016) 30 tablet 5   No facility-administered medications prior to visit.     ROS Review of Systems  Constitutional: Negative.  Negative for activity change, appetite change, chills, diaphoresis, fatigue and unexpected weight change.  HENT: Negative.  Negative for sinus pressure, sore throat, trouble swallowing and voice change.   Eyes: Negative.  Negative for visual disturbance.  Respiratory: Negative for cough, chest tightness, shortness of breath, wheezing and stridor.   Cardiovascular: Positive for chest pain. Negative for palpitations and leg swelling.  Gastrointestinal: Positive for constipation. Negative for abdominal distention, abdominal pain, diarrhea, nausea and vomiting.  Endocrine: Negative.   Genitourinary: Negative.  Negative for difficulty urinating, flank  pain, frequency, hematuria and urgency.  Musculoskeletal: Negative.  Negative for back pain, myalgias and neck pain.  Skin: Negative.  Negative for color change, pallor and rash.  Allergic/Immunologic: Negative.   Neurological: Negative.  Negative for dizziness, weakness, light-headedness and numbness.  Hematological: Negative.  Negative for adenopathy. Does not bruise/bleed easily.  Psychiatric/Behavioral: Negative.     Objective:  BP 130/88 (BP Location: Left Arm, Patient Position: Sitting, Cuff Size: Normal)   Pulse 75   Temp 98.3 F (36.8 C) (Oral)   Resp 16   Ht 5\' 3"  (1.6 m)   Wt 193 lb 4 oz (87.7 kg)   LMP 09/14/2016   SpO2 97%   BMI 34.23 kg/m   BP Readings from Last 3 Encounters:  12/12/16 130/88  06/17/16 108/80  05/16/16 109/72    Wt Readings from Last 3 Encounters:  12/12/16 193 lb 4 oz (87.7 kg)  06/17/16 178 lb (80.7 kg)  05/16/16 189 lb (85.7 kg)    Physical Exam  Constitutional: She is oriented to person, place, and time. She appears well-developed and well-nourished. No distress.  HENT:  Head: Normocephalic and atraumatic.  Mouth/Throat: Oropharynx is clear and moist. No oropharyngeal exudate.  Eyes: Conjunctivae are normal. Right eye exhibits no discharge. Left eye exhibits no discharge. No scleral icterus.  Neck: Normal range of motion. Neck supple. No JVD present. No tracheal deviation present. No thyromegaly present.  Cardiovascular: Normal rate, regular rhythm, normal heart sounds and intact distal pulses.  Exam reveals no gallop and no friction rub.   No murmur heard. Pulmonary/Chest: Effort  normal and breath sounds normal. No accessory muscle usage or stridor. No respiratory distress. She has no wheezes. She has no rales. Chest wall is not dull to percussion. She exhibits tenderness. She exhibits no mass, no bony tenderness, no laceration, no crepitus, no edema, no deformity, no swelling and no retraction. Right breast exhibits no inverted nipple, no  mass, no nipple discharge, no skin change and no tenderness. Left breast exhibits no inverted nipple, no mass, no nipple discharge, no skin change and no tenderness. Breasts are symmetrical.  There is diffuse tenderness palpation along the rib cage bilaterally  Abdominal: Soft. Bowel sounds are normal. She exhibits no distension and no mass. There is no tenderness. There is no rebound and no guarding.  Musculoskeletal: Normal range of motion. She exhibits no edema, tenderness or deformity.  Lymphadenopathy:    She has no cervical adenopathy.  Neurological: She is oriented to person, place, and time.  Skin: Skin is warm and dry. No rash noted. She is not diaphoretic. No erythema. No pallor.  Psychiatric: She has a normal mood and affect. Her behavior is normal. Judgment and thought content normal.  Vitals reviewed.   Lab Results  Component Value Date   WBC 6.3 12/12/2016   HGB 12.7 12/12/2016   HCT 39.8 12/12/2016   PLT 372.0 12/12/2016   GLUCOSE 79 12/12/2016   CHOL 224 (H) 03/10/2016   TRIG 75.0 03/10/2016   HDL 74.40 03/10/2016   LDLCALC 134 (H) 03/10/2016   ALT 15 12/12/2016   AST 23 12/12/2016   NA 140 12/12/2016   K 3.9 12/12/2016   CL 104 12/12/2016   CREATININE 0.58 12/12/2016   BUN 11 12/12/2016   CO2 30 12/12/2016   TSH 0.92 03/10/2016    Dg Chest 2 View  Result Date: 06/17/2016 CLINICAL DATA:  Positive TB test, acute upper respiratory infection, shortness of breath, hypertension EXAM: CHEST  2 VIEW COMPARISON:  None FINDINGS: Upper normal heart size. Mediastinal contours and pulmonary vascularity normal. Lungs clear. No pleural effusion or pneumothorax. Bones unremarkable. IMPRESSION: No acute abnormalities. Electronically Signed   By: Ulyses Southward M.D.   On: 06/17/2016 14:11    Assessment & Plan:   Katelyn Vargas was seen today for hypertension.  Diagnoses and all orders for this visit:  Visit for screening mammogram  Amenorrhea- her beta hCG is negative for  pregnancy, if she doesn't have a cycle soon and I will ask her to see her gynecologist -     hCG, quantitative, pregnancy; Future  Essential hypertension- her blood pressure is adequately well controlled, electrolytes and renal function are normal. -     Comprehensive metabolic panel; Future -     CBC with Differential/Platelet; Future -     Urinalysis, Routine w reflex microscopic; Future  Intercostal pain- her exam and chest x-ray are normal, her symptoms are consistent with musculoskeletal chest wall pain. I've asked her to start taking an anti-inflammatory as needed for the discomfort. -     DG Chest 2 View; Future -     etodolac (LODINE XL) 400 MG 24 hr tablet; Take 1 tablet (400 mg total) by mouth daily.  Chronic idiopathic constipation- she has responded well to Amitiza, will continue. -     lubiprostone (AMITIZA) 24 MCG capsule; Take 1 capsule (24 mcg total) by mouth daily with breakfast.   I have discontinued Ms. Barman Lorcaserin HCl ER and cefUROXime. I am also having her start on etodolac. Additionally, I am having her maintain her  ferrous sulfate, chlorthalidone, and lubiprostone.  Meds ordered this encounter  Medications  . lubiprostone (AMITIZA) 24 MCG capsule    Sig: Take 1 capsule (24 mcg total) by mouth daily with breakfast.    Dispense:  180 capsule    Refill:  1  . etodolac (LODINE XL) 400 MG 24 hr tablet    Sig: Take 1 tablet (400 mg total) by mouth daily.    Dispense:  90 tablet    Refill:  0     Follow-up: Return in about 3 weeks (around 01/02/2017).  Sanda Lingerhomas Akayla Brass, MD

## 2016-12-13 ENCOUNTER — Encounter: Payer: Self-pay | Admitting: Internal Medicine

## 2016-12-13 LAB — HCG, QUANTITATIVE, PREGNANCY: QUANTITATIVE HCG: 1.97 m[IU]/mL

## 2016-12-13 NOTE — Progress Notes (Unsigned)
Your pregnancy test is negative The labs are all okay I don't see any concerns here

## 2016-12-14 NOTE — Telephone Encounter (Signed)
LOV 12/12/2016.  Erx sent as requested by phramacy.

## 2016-12-26 ENCOUNTER — Telehealth: Payer: Self-pay | Admitting: Internal Medicine

## 2016-12-26 DIAGNOSIS — Z1239 Encounter for other screening for malignant neoplasm of breast: Secondary | ICD-10-CM

## 2016-12-26 NOTE — Telephone Encounter (Signed)
Pt called checking on an order for a mammogram. She said that she was under the impression that this was going to be ordered for her after her visit with Dr Yetta BarreJones. [place] and would like the order to be sent somewhere near her. Please advise.  Thanks [employer]

## 2016-12-26 NOTE — Telephone Encounter (Signed)
LVM for pt to call back as soon as possible.   RE: Need the name an number to the location that will do the mammogram. I will contact and fax to that location.

## 2016-12-27 NOTE — Telephone Encounter (Signed)
This has been faxed.

## 2016-12-27 NOTE — Telephone Encounter (Signed)
Name:  [facility] Address : [address]  [address]  [place] [id number] Phone : [phone] Fax:  [phone]

## 2017-01-23 ENCOUNTER — Ambulatory Visit (INDEPENDENT_AMBULATORY_CARE_PROVIDER_SITE_OTHER): Payer: No Typology Code available for payment source | Admitting: Orthopaedic Surgery

## 2017-07-11 IMAGING — DX DG CHEST 2V
2 series · 2 of 2 positions shown · non-contrast
Comparison: None

CLINICAL DATA: Positive TB test, acute upper respiratory infection,
shortness of breath, hypertension

EXAM:
CHEST  2 VIEW

[chest pa]
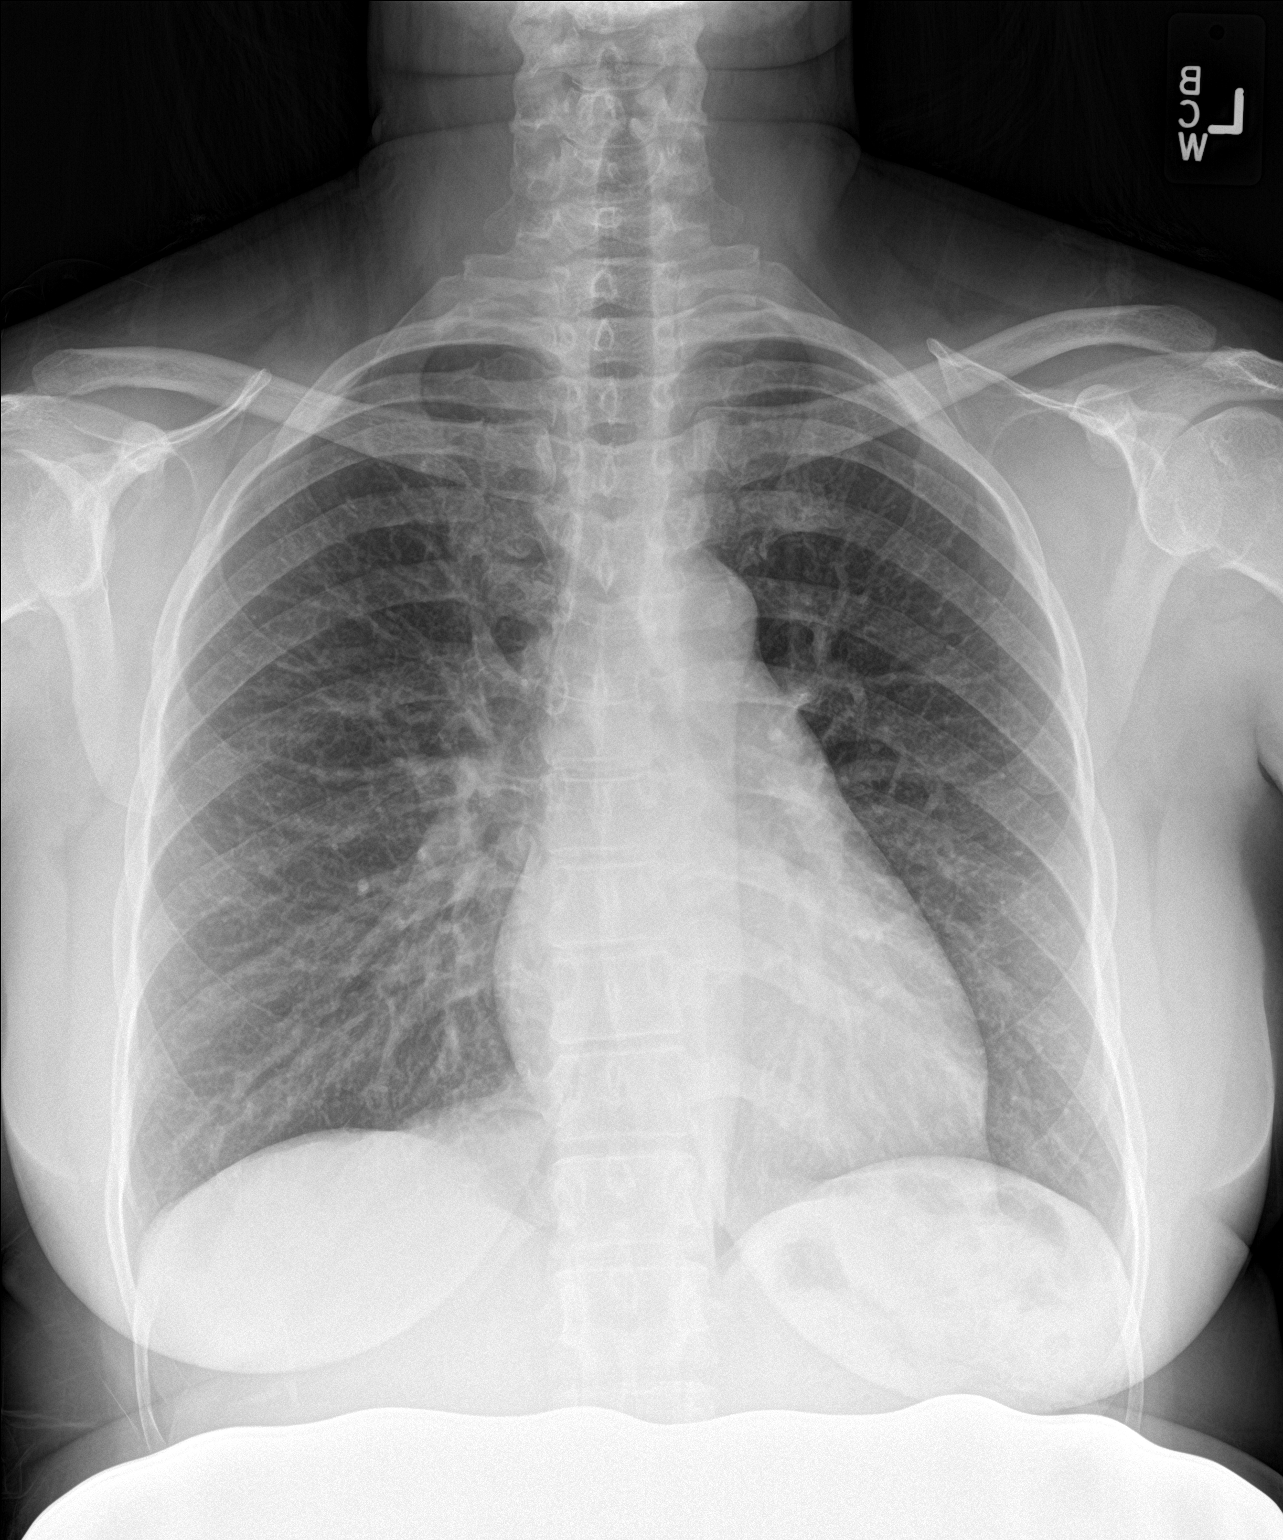

[chest lat]
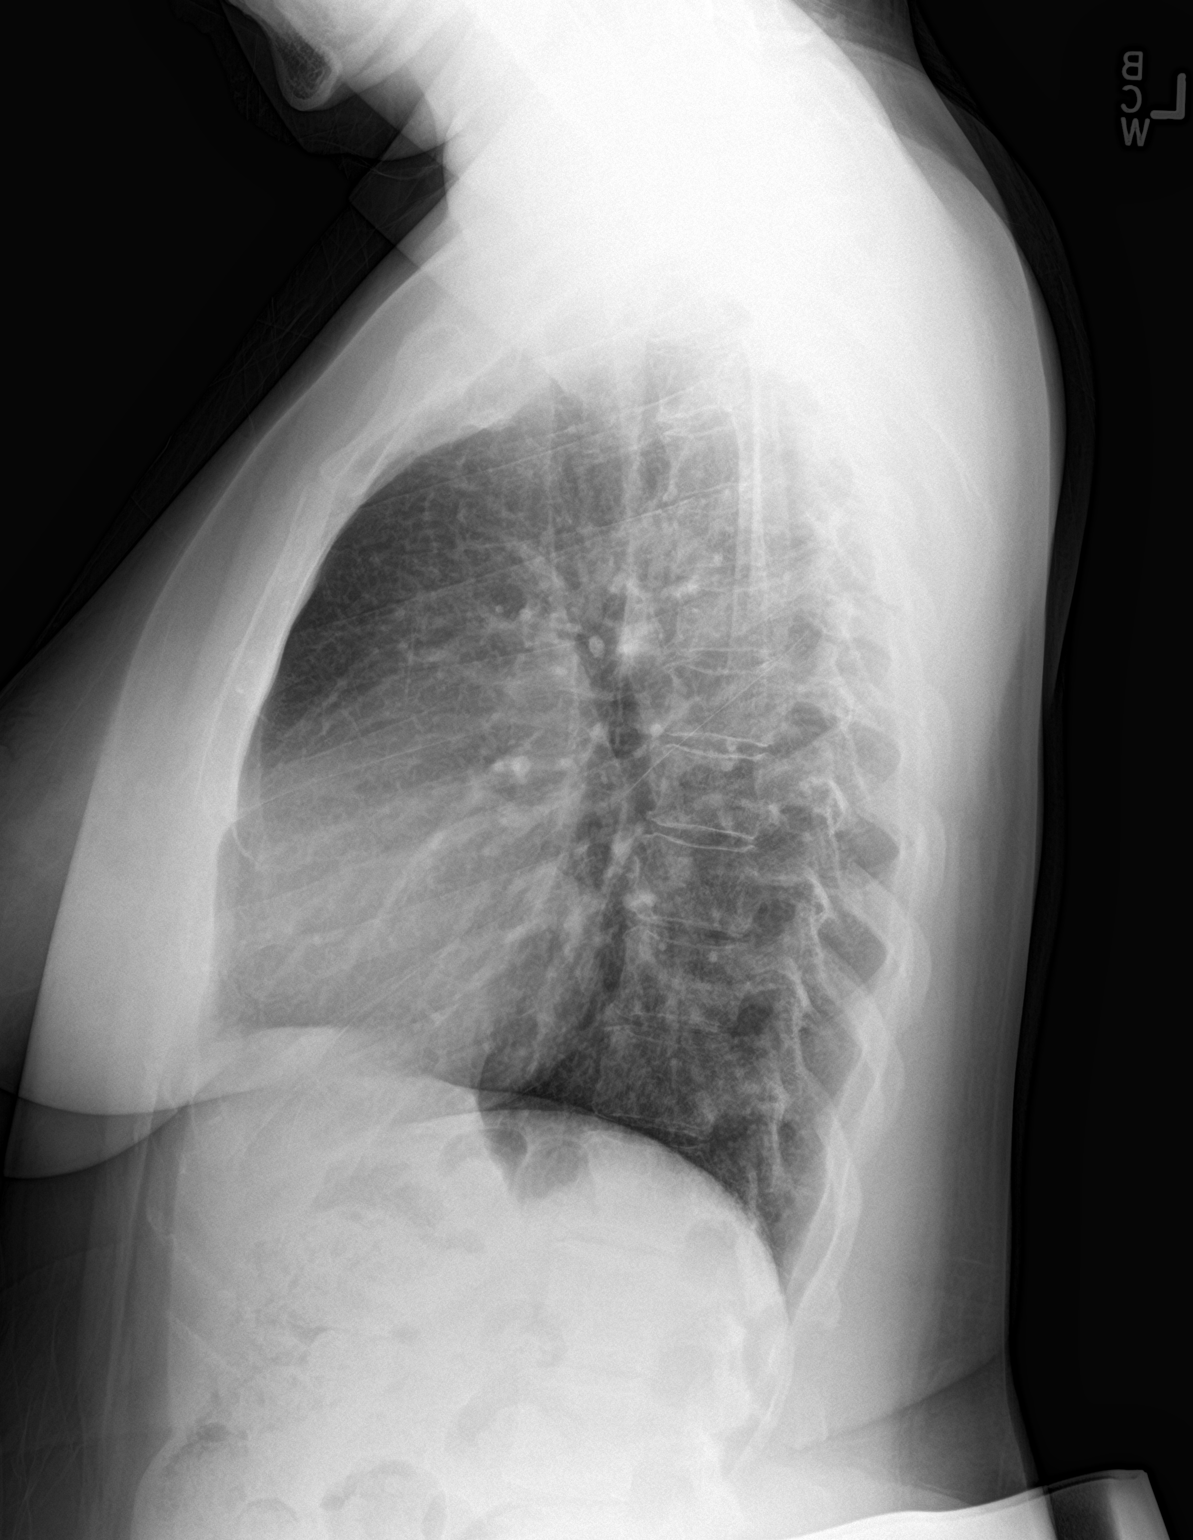

[2 of 2 positions shown; findings below may reference images not displayed]

FINDINGS: Upper normal heart size.

Mediastinal contours and pulmonary vascularity normal.

Lungs clear.

No pleural effusion or pneumothorax.

Bones unremarkable.
IMPRESSION: No acute abnormalities.
